# Patient Record
Sex: Male | Born: 1955 | Race: Black or African American | Hispanic: No | Smoking: Never smoker
Health system: Southern US, Community
[De-identification: ages and names within clinical notes are randomized; demographics above are authoritative.]

## PROBLEM LIST (undated history)

## (undated) DIAGNOSIS — I10 Essential (primary) hypertension: Secondary | ICD-10-CM

## (undated) DIAGNOSIS — N200 Calculus of kidney: Secondary | ICD-10-CM

## (undated) DIAGNOSIS — K219 Gastro-esophageal reflux disease without esophagitis: Secondary | ICD-10-CM

## (undated) HISTORY — PX: COLONOSCOPY: SHX174

---

## 2000-02-18 ENCOUNTER — Emergency Department (HOSPITAL_COMMUNITY): Admission: EM | Admit: 2000-02-18 | Discharge: 2000-02-18 | Payer: Self-pay | Admitting: Emergency Medicine

## 2009-08-13 ENCOUNTER — Encounter: Admission: RE | Admit: 2009-08-13 | Discharge: 2009-08-13 | Payer: Self-pay | Admitting: Specialist

## 2010-09-09 ENCOUNTER — Emergency Department (HOSPITAL_COMMUNITY): Admission: EM | Admit: 2010-09-09 | Discharge: 2010-09-09 | Payer: Self-pay | Admitting: Emergency Medicine

## 2010-11-24 ENCOUNTER — Encounter: Payer: Self-pay | Admitting: Internal Medicine

## 2011-02-14 ENCOUNTER — Ambulatory Visit
Admission: RE | Admit: 2011-02-14 | Discharge: 2011-02-14 | Disposition: A | Discharge status: Home / Self Care | Payer: Self-pay | Source: Ambulatory Visit | Attending: Specialist | Admitting: Specialist

## 2011-02-14 ENCOUNTER — Other Ambulatory Visit: Payer: Self-pay | Admitting: Specialist

## 2011-04-17 ENCOUNTER — Other Ambulatory Visit: Payer: Self-pay | Admitting: Internal Medicine

## 2011-04-17 ENCOUNTER — Ambulatory Visit
Admission: RE | Admit: 2011-04-17 | Discharge: 2011-04-17 | Disposition: A | Discharge status: Home / Self Care | Payer: Self-pay | Source: Ambulatory Visit | Attending: Internal Medicine | Admitting: Internal Medicine

## 2011-04-17 DIAGNOSIS — G44009 Cluster headache syndrome, unspecified, not intractable: Secondary | ICD-10-CM

## 2011-06-04 ENCOUNTER — Other Ambulatory Visit: Payer: Self-pay | Admitting: Internal Medicine

## 2011-06-04 DIAGNOSIS — M545 Low back pain, unspecified: Secondary | ICD-10-CM

## 2011-06-05 ENCOUNTER — Ambulatory Visit
Admission: RE | Admit: 2011-06-05 | Discharge: 2011-06-05 | Disposition: A | Discharge status: Home / Self Care | Payer: PRIVATE HEALTH INSURANCE | Source: Ambulatory Visit | Attending: Internal Medicine | Admitting: Internal Medicine

## 2011-06-05 DIAGNOSIS — M545 Low back pain, unspecified: Secondary | ICD-10-CM

## 2012-10-22 ENCOUNTER — Emergency Department (HOSPITAL_COMMUNITY)
Admission: EM | Admit: 2012-10-22 | Discharge: 2012-10-22 | Disposition: A | Discharge status: Home / Self Care | Payer: Self-pay | Attending: Emergency Medicine | Admitting: Emergency Medicine

## 2012-10-22 ENCOUNTER — Encounter (HOSPITAL_COMMUNITY): Payer: Self-pay | Admitting: Neurology

## 2012-10-22 DIAGNOSIS — IMO0002 Reserved for concepts with insufficient information to code with codable children: Secondary | ICD-10-CM | POA: Insufficient documentation

## 2012-10-22 DIAGNOSIS — R1030 Lower abdominal pain, unspecified: Secondary | ICD-10-CM

## 2012-10-22 DIAGNOSIS — Y9389 Activity, other specified: Secondary | ICD-10-CM | POA: Insufficient documentation

## 2012-10-22 DIAGNOSIS — W108XXA Fall (on) (from) other stairs and steps, initial encounter: Secondary | ICD-10-CM | POA: Insufficient documentation

## 2012-10-22 DIAGNOSIS — Y9289 Other specified places as the place of occurrence of the external cause: Secondary | ICD-10-CM | POA: Insufficient documentation

## 2012-10-22 HISTORY — DX: Essential (primary) hypertension: I10

## 2012-10-22 MED ORDER — NAPROXEN 500 MG PO TABS: 500.0000 mg | ORAL_TABLET | Freq: Two times a day (BID) | ORAL | Status: DC

## 2012-10-22 MED ORDER — HYDROCODONE-ACETAMINOPHEN 5-325 MG PO TABS: ORAL_TABLET | ORAL | Status: DC

## 2012-10-22 NOTE — ED Notes (Signed)
Pt is ambulatory. C/o groin pain. Pt was comfortable, resting comfortably in bed. Unable to due nursing assessment of groin due to patient being discharge by PA quickly. When in to assess patient, patient was already dressed and ready for discharge. Pt was seen by PA. Pt was alert and oriented in NAD.

## 2012-10-22 NOTE — ED Provider Notes (Signed)
History     CSN: [id number]  Arrival date & time 10/22/12  2841   First MD Initiated Contact with Patient 10/22/12 [phone]      Chief Complaint  Patient presents with  . Hip Pain    (Consider location/radiation/quality/duration/timing/severity/associated sxs/prior treatment) HPI Comments: Patient presents with complaint of left groin pain for 4 weeks. 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 He has had pain since that time that is worse with movement and walking. He has not treated himself with any medications, ice, or heat. Patient denies any changes in urine including dysuria, hematuria. He denies any change in bowel movements including diarrhea, constipation, or blood in stool. He denies fever, nausea or vomiting. Onset was acute. Course is constant. Nothing makes symptoms better.  The history is provided by the patient.    No past medical history on file.  No past surgical history on file.  No family history on file.  History  Substance Use Topics  . Smoking status: Not on file  . Smokeless tobacco: Not on file  . Alcohol Use: Not on file      Review of Systems  Constitutional: Negative for fever.  HENT: Negative for neck pain.   Eyes: Negative for redness.  Respiratory: Negative for cough.   Cardiovascular: Negative for chest pain.  Gastrointestinal: Negative for nausea, vomiting, abdominal pain, diarrhea and blood in stool.  Genitourinary: Negative for dysuria, hematuria and flank pain.  Musculoskeletal: Positive for myalgias (L groin).  Skin: Negative for rash.  Neurological: Negative for headaches.    Allergies  Review of patient's allergies indicates no known allergies.  Home Medications   Current Outpatient Rx  Name  Route  Sig  Dispense  Refill  . HYDROCHLOROTHIAZIDE 25 MG PO TABS   Oral   Take 25 mg by mouth daily.           BP 125/81  Pulse 79  Temp 98.4 F (36.9 C)  (Oral)  Resp 15  SpO2 94%  Physical Exam  Nursing note and vitals reviewed. Constitutional: He appears well-developed and well-nourished.  HENT:  Head: Normocephalic and atraumatic.  Eyes: Conjunctivae normal are normal. Right eye exhibits no discharge. Left eye exhibits no discharge.  Neck: Normal range of motion. Neck supple.  Cardiovascular: Normal rate, regular rhythm and normal heart sounds.   Pulmonary/Chest: Effort normal and breath sounds normal.  Abdominal: Soft. Bowel sounds are normal. He exhibits no distension and no mass. There is no tenderness. There is no rebound and no guarding. Hernia confirmed negative in the right inguinal area and confirmed negative in the left inguinal area.  Genitourinary: Penis normal.    Right testis shows no mass and no tenderness. Left testis shows no mass and no tenderness.  Musculoskeletal:       Left hip: He exhibits tenderness. He exhibits normal strength, no bony tenderness and no swelling.       Legs:      Patient is tender in the crease of left groin however I do not feel any bulging masses that would be consistent with a hernia. On hernia exam no hernias felt. Suspect adductor strain.   Lymphadenopathy:       Right: No inguinal adenopathy present.       Left: No inguinal adenopathy present.  Neurological: He is alert.  Skin: Skin is warm and dry.  Psychiatric: He has a normal mood and affect.  ED Course  Procedures (including critical care time)  Labs Reviewed - No data to display No results found.   1. Groin pain     9:22 AM Patient seen and examined. Work-up initiated.   Vital signs reviewed and are as follows: Filed Vitals:   10/22/12 0945  BP: 125/81  Pulse: 79  Temp: 98.4 F (36.9 C)  Resp: 15   Patient examined.  Will treat conservatively with anti-inflammatories and pain medication. Patient states he has a history of ulcer but states "it's not that bad". Urged use of NSAIDs with food and to discontinue if  stomach pain against.  Patient counseled on use of narcotic pain medications. Counseled not to combine these medications with others containing tylenol. Urged not to drink alcohol, drive, or perform any other activities that requires focus while taking these medications. The patient verbalizes understanding and agrees with the plan.  Patient encouraged to return with signs and symptoms of obstruction, worsening pain. Urged to followup with surgery referral if not improved in one week.    MDM  Groin pain that started when patient took a step. I suspect this represents a healing groin (adductor) strain. Do not feel anything on exam today that would make me suspicious of a hernia. Patient does not have any signs or symptoms of obstruction. No skin findings or cellulitis. Will treat conservatively for another week. Surgery followup given to rule out hernia if not improved.        Renne Crigler, [place] 10/22/12 1017

## 2012-10-22 NOTE — ED Notes (Signed)
Pt reporting fell yesterday c/o left hip/groin pain. Pt is ambulatory. Pt is alert and oriented.

## 2012-10-26 NOTE — ED Provider Notes (Signed)
Medical screening examination/treatment/procedure(s) were performed by non-physician practitioner and as supervising physician I was immediately available for consultation/collaboration.  Tasheem Elms, MD 10/26/12 1921 

## 2013-03-14 ENCOUNTER — Other Ambulatory Visit: Payer: Self-pay | Admitting: Internal Medicine

## 2013-03-14 ENCOUNTER — Ambulatory Visit
Admission: RE | Admit: 2013-03-14 | Discharge: 2013-03-14 | Disposition: A | Discharge status: Home / Self Care | Payer: No Typology Code available for payment source | Source: Ambulatory Visit | Attending: Internal Medicine | Admitting: Internal Medicine

## 2013-03-14 DIAGNOSIS — T1490XA Injury, unspecified, initial encounter: Secondary | ICD-10-CM

## 2015-04-23 ENCOUNTER — Encounter (HOSPITAL_COMMUNITY): Payer: Self-pay | Admitting: *Deleted

## 2015-04-23 ENCOUNTER — Emergency Department (HOSPITAL_COMMUNITY)
Admission: EM | Admit: 2015-04-23 | Discharge: 2015-04-23 | Disposition: A | Discharge status: Home / Self Care | Payer: No Typology Code available for payment source | Attending: Emergency Medicine | Admitting: Emergency Medicine

## 2015-04-23 DIAGNOSIS — I10 Essential (primary) hypertension: Secondary | ICD-10-CM | POA: Diagnosis not present

## 2015-04-23 DIAGNOSIS — Z791 Long term (current) use of non-steroidal anti-inflammatories (NSAID): Secondary | ICD-10-CM | POA: Insufficient documentation

## 2015-04-23 DIAGNOSIS — K1379 Other lesions of oral mucosa: Secondary | ICD-10-CM | POA: Diagnosis not present

## 2015-04-23 DIAGNOSIS — Z79899 Other long term (current) drug therapy: Secondary | ICD-10-CM | POA: Insufficient documentation

## 2015-04-23 NOTE — ED Notes (Signed)
Declined W/C at D/C and was escorted to lobby by RN. 

## 2015-04-23 NOTE — ED Provider Notes (Signed)
CSN: [id number]     Arrival date & time 04/23/15  6295 History   First MD Initiated Contact with Patient 04/23/15 [phone]     Chief Complaint  Patient presents with  . Mouth Injury     (Consider location/radiation/quality/duration/timing/severity/associated sxs/prior Treatment) HPI Comments: 59 y/o M c/o bleeding from the roof of his mouth "for a while now". Bleeding is intermittent, tends to happen more if he "rubs" the roof of his mouth with his finger. States the bleeding has not happened in about 2 weeks. Nothing in specific happened this morning to bring him to the ED, other than he thought it would be good to have it checked out. Has not discussed this with his PCP. Occasionally it hurts when it is bleeding and only bleeds from the left side. Denies dental pain. No hx of bleeding disorders. Not on any blood thinners.  The history is provided by the patient.    Past Medical History  Diagnosis Date  . Hypertension    History reviewed. No pertinent past surgical history. History reviewed. No pertinent family history. History  Substance Use Topics  . Smoking status: Never Smoker   . Smokeless tobacco: Not on file  . Alcohol Use: No    Review of Systems  HENT:       + Bleeding from roof of mouth.  Hematological: Does not bruise/bleed easily.  All other systems reviewed and are negative.     Allergies  Review of patient's allergies indicates no known allergies.  Home Medications   Prior to Admission medications   Medication Sig Start Date End Date Taking? Authorizing Provider  hydrochlorothiazide (HYDRODIURIL) 25 MG tablet Take 25 mg by mouth daily.    Historical Provider, MD  HYDROcodone-acetaminophen (NORCO/VICODIN) 5-325 MG per tablet Take 1-2 tablets every 6 hours as needed for severe pain 10/22/12   Renne Crigler, PA-C  naproxen (NAPROSYN) 500 MG tablet Take 1 tablet (500 mg total) by mouth 2 (two) times daily. 10/22/12   Renne Crigler, PA-C   BP 148/82 mmHg  Pulse  75  Temp(Src) 97.9 F (36.6 C) (Oral)  Resp 16  Ht 5\' 9"  (1.753 m)  Wt 220 lb (99.791 kg)  BMI 32.47 kg/m2  SpO2 99% Physical Exam  Constitutional: He is oriented to person, place, and time. He appears well-developed and well-nourished. No distress.  HENT:  Head: Normocephalic and atraumatic.  Mouth/Throat: Uvula is midline, oropharynx is clear and moist and mucous membranes are normal. No oral lesions.  No bleeding from roof of mouth. No inflammation. No lesions, injury or masses.  Eyes: Conjunctivae and EOM are normal.  Neck: Normal range of motion. Neck supple.  Cardiovascular: Normal rate, regular rhythm and normal heart sounds.   Pulmonary/Chest: Effort normal and breath sounds normal.  Musculoskeletal: Normal range of motion. He exhibits no edema.  Lymphadenopathy:       Head (right side): No submental and no submandibular adenopathy present.       Head (left side): No submental and no submandibular adenopathy present.  Neurological: He is alert and oriented to person, place, and time.  Skin: Skin is warm and dry.  Psychiatric: He has a normal mood and affect. His behavior is normal.  Nursing note and vitals reviewed.   ED Course  Procedures (including critical care time) Labs Review Labs Reviewed - No data to display  Imaging Review No results found.   EKG Interpretation None      MDM   Final diagnoses:  Bleeding in mouth  Non-toxic appearing, NAD. Afebrile. VSS. No lesions, injury, masses or bleeding on roof of mouth. Has not bled in 2 weeks, worse when rubbing. No hx of bleeding d/o or on blood thinners. No emergent need for any workup today, and can f/u with PCP. Stable for d/c. Return precautions given. Patient states understanding of treatment care plan and is agreeable.  Kathrynn Speed, PA-C 04/23/15 9147  Blane Ohara, MD 04/24/15 [phone]

## 2015-04-23 NOTE — ED Notes (Signed)
Pt reports he has had several day Hx of bleeding from roof of mouth. Pt denies any dental pain.

## 2017-11-03 DIAGNOSIS — I82409 Acute embolism and thrombosis of unspecified deep veins of unspecified lower extremity: Secondary | ICD-10-CM

## 2017-11-03 HISTORY — DX: Acute embolism and thrombosis of unspecified deep veins of unspecified lower extremity: I82.409

## 2019-04-04 ENCOUNTER — Other Ambulatory Visit: Payer: Self-pay

## 2019-04-04 ENCOUNTER — Encounter (HOSPITAL_COMMUNITY): Payer: Self-pay

## 2019-04-04 ENCOUNTER — Observation Stay (HOSPITAL_COMMUNITY)
Admission: EM | Admit: 2019-04-04 | Discharge: 2019-04-05 | Disposition: A | Discharge status: Home / Self Care | Payer: BLUE CROSS/BLUE SHIELD | Attending: Internal Medicine | Admitting: Internal Medicine

## 2019-04-04 ENCOUNTER — Emergency Department (HOSPITAL_COMMUNITY): Payer: BLUE CROSS/BLUE SHIELD

## 2019-04-04 DIAGNOSIS — I10 Essential (primary) hypertension: Secondary | ICD-10-CM | POA: Diagnosis present

## 2019-04-04 DIAGNOSIS — G8929 Other chronic pain: Secondary | ICD-10-CM | POA: Diagnosis not present

## 2019-04-04 DIAGNOSIS — R079 Chest pain, unspecified: Principal | ICD-10-CM | POA: Insufficient documentation

## 2019-04-04 DIAGNOSIS — Z79899 Other long term (current) drug therapy: Secondary | ICD-10-CM | POA: Insufficient documentation

## 2019-04-04 DIAGNOSIS — K219 Gastro-esophageal reflux disease without esophagitis: Secondary | ICD-10-CM | POA: Diagnosis not present

## 2019-04-04 DIAGNOSIS — Z1159 Encounter for screening for other viral diseases: Secondary | ICD-10-CM | POA: Insufficient documentation

## 2019-04-04 DIAGNOSIS — Z7982 Long term (current) use of aspirin: Secondary | ICD-10-CM | POA: Diagnosis not present

## 2019-04-04 DIAGNOSIS — Z791 Long term (current) use of non-steroidal anti-inflammatories (NSAID): Secondary | ICD-10-CM | POA: Diagnosis not present

## 2019-04-04 DIAGNOSIS — Z86718 Personal history of other venous thrombosis and embolism: Secondary | ICD-10-CM | POA: Diagnosis not present

## 2019-04-04 HISTORY — DX: Chest pain, unspecified: R07.9

## 2019-04-04 LAB — BASIC METABOLIC PANEL
Anion gap: 8 (ref 5–15)
BUN: 19 mg/dL (ref 8–23)
CO2: 28 mmol/L (ref 22–32)
Calcium: 8.9 mg/dL (ref 8.9–10.3)
Chloride: 100 mmol/L (ref 98–111)
Creatinine, Ser: 1.43 mg/dL — ABNORMAL HIGH (ref 0.61–1.24)
GFR calc Af Amer: >60 mL/min (ref >60)
GFR calc non Af Amer: 52 mL/min — ABNORMAL LOW (ref >60)
Glucose, Bld: 119 mg/dL — ABNORMAL HIGH (ref 70–99)
Potassium: 3.6 mmol/L (ref 3.5–5.1)
Sodium: 136 mmol/L (ref 135–145)

## 2019-04-04 LAB — CBC
HCT: 44.4 % (ref 39.0–52.0)
Hemoglobin: 14.6 g/dL (ref 13.0–17.0)
MCH: 29 pg (ref 26.0–34.0)
MCHC: 32.9 g/dL (ref 30.0–36.0)
MCV: 88.3 fL (ref 80.0–100.0)
Platelets: 229 10*3/uL (ref 150–400)
RBC: 5.03 MIL/uL (ref 4.22–5.81)
RDW: 12.5 % (ref 11.5–15.5)
WBC: 6 10*3/uL (ref 4.0–10.5)
nRBC: 0 % (ref 0.0–0.2)

## 2019-04-04 LAB — TROPONIN I
Troponin I: <0.03 ng/mL (ref <0.03)
Troponin I: <0.03 ng/mL (ref <0.03)

## 2019-04-04 LAB — D-DIMER, QUANTITATIVE: D-Dimer, Quant: 0.33 ug/mL-FEU (ref 0.00–0.50)

## 2019-04-04 LAB — SARS CORONAVIRUS 2 BY RT PCR (HOSPITAL ORDER, PERFORMED IN ~~LOC~~ HOSPITAL LAB): SARS Coronavirus 2: NEGATIVE

## 2019-04-04 MED ORDER — ASPIRIN 81 MG PO CHEW
324.0000 mg | CHEWABLE_TABLET | Freq: Once | ORAL | Status: AC
  Administered 2019-04-04: 21:00:00 324 mg via ORAL
  Filled 2019-04-04: qty 4

## 2019-04-04 MED ORDER — SODIUM CHLORIDE 0.9% FLUSH: 3.0000 mL | Freq: Once | INTRAVENOUS | Status: DC

## 2019-04-04 MED ORDER — IOHEXOL 350 MG/ML SOLN
100.0000 mL | Freq: Once | INTRAVENOUS | Status: AC | PRN
  Administered 2019-04-04: 21:00:00 100 mL via INTRAVENOUS

## 2019-04-04 MED ORDER — SODIUM CHLORIDE (PF) 0.9 % IJ SOLN
INTRAMUSCULAR | Status: AC
  Filled 2019-04-04: qty 50

## 2019-04-04 NOTE — H&P (Signed)
Jimmy Hamilton KGS:[id number] DOB: 04/04/1956 DOA: 04/04/2019     PCP: Rometta Emery, MD   Outpatient Specialists:  NONE    Patient arrived to ER on 04/04/19 at 1632  Patient coming from: home Lives alone,    Chief Complaint:  Chief Complaint  Patient presents with   Chest Pain    HPI: Jimmy Hamilton is a 63 y.o. male with medical history significant of GERD, HTN    Presented with   Chest pain and intermittent worse since yesterday no associated shortness of breath no nausea diaphoresis also have had some abdominal pain for the past 1 week reports left lower extremity pain behind the knee states has been going on for a long time.  States currently feeling better He is unsure for how long the chest pain has lasted Had any stress test or otherwise cardiac work-up  Infectious risk factors:  Reports chest pain  In RAPID COVID TEST NEGATIVE     Regarding pertinent Chronic problems:      HTN on HCTZ    While in ER: CTA neg for PE No infiltrate Troponin negative The following Work up has been ordered so far:  Orders Placed This Encounter  Procedures   SARS Coronavirus 2 (CEPHEID - Performed in [facility] hospital lab), Hosp Order   DG Chest 2 View   CT Angio Chest PE W and/or Wo Contrast   Basic metabolic panel   CBC   Troponin I - ONCE - STAT   Troponin I - Now Then Q6H   D-dimer, quantitative (not at [facility])   Cardiac monitoring   Saline Lock IV, Maintain IV access   Consult to hospitalist  ALL PATIENTS BEING ADMITTED/HAVING PROCEDURES NEED COVID-19 SCREENING   Consult to hospitalist  ALL PATIENTS BEING ADMITTED/HAVING PROCEDURES NEED COVID-19 SCREENING   Pulse oximetry, continuous   EKG 12-Lead   ED EKG within 10 minutes   EKG 12-Lead   Place in observation (patient's expected length of stay will be less than 2 midnights)     Following Medications were ordered in ER: Medications  sodium chloride flush (NS) 0.9 % injection 3 mL (has no  administration in time range)  sodium chloride (PF) 0.9 % injection (has no administration in time range)  aspirin chewable tablet 324 mg (324 mg Oral Given 04/04/19 2046)  iohexol (OMNIPAQUE) 350 MG/ML injection 100 mL (100 mLs Intravenous Contrast Given 04/04/19 2107)        Consult Orders  (From admission, onward)         Start     Ordered   04/04/19 2204  Consult to hospitalist  ALL PATIENTS BEING ADMITTED/HAVING PROCEDURES NEED COVID-19 SCREENING  Once    Comments:  ALL PATIENTS BEING ADMITTED/HAVING PROCEDURES NEED COVID-19 SCREENING  Provider:  John Giovanni, MD  Question Answer Comment  Place call to: [facility]   Reason for Consult Admit      04/04/19 2203   04/04/19 2133  Consult to hospitalist  ALL PATIENTS BEING ADMITTED/HAVING PROCEDURES NEED COVID-19 SCREENING  Once    Comments:  ALL PATIENTS BEING ADMITTED/HAVING PROCEDURES NEED COVID-19 SCREENING  Provider:  John Giovanni, MD  Question Answer Comment  Place call to: [facility]   Reason for Consult Admit      04/04/19 2132           Significant initial  Findings: Abnormal Labs Reviewed  BASIC METABOLIC PANEL - Abnormal; Notable for the following components:      Result Value  Glucose, Bld 119 (*)    Creatinine, Ser 1.43 (*)    GFR calc non Af Amer 52 (*)    All other components within normal limits     Otherwise labs showing:    Recent Labs  Lab 04/04/19 1957  NA 136  K 3.6  CO2 28  GLUCOSE 119*  BUN 19  CREATININE 1.43*  CALCIUM 8.9    Cr unclear baseline Lab Results  Component Value Date   CREATININE 1.43 (H) 04/04/2019    No results for input(s): AST, ALT, ALKPHOS, BILITOT, PROT, ALBUMIN in the last 168 hours. Lab Results  Component Value Date   CALCIUM 8.9 04/04/2019      WBC       Component Value Date/Time   WBC 6.0 04/04/2019 1957     Plt: Lab Results  Component Value Date   PLT 229 04/04/2019     COVID-19 Labs  Recent Labs     04/04/19 1957  DDIMER 0.33    Lab Results  Component Value Date   SARSCOV2NAA NEGATIVE 04/04/2019      HG/HCT stable,  Down *Up from baseline see below    Component Value Date/Time   HGB 14.6 04/04/2019 1957   HCT 44.4 04/04/2019 1957    Troponin  Cardiac Panel (last 3 results) Recent Labs    04/04/19 1957 04/04/19 2245  TROPONINI <0.03 <0.03         CT HEAD * NON acute  CXR - *NON acute    CTA chest -  nonacute, no PE,  no evidence of infiltrate   ECG:  Personally reviewed by me showing: HR : 77 Rhythm:  NSR nonspecific changes,  QTC 426      ED Triage Vitals  Enc Vitals Group     BP 04/04/19 1638 (!) 155/73     Pulse Rate 04/04/19 1638 80     Resp 04/04/19 1638 16     Temp 04/04/19 1638 98.2 F (36.8 C)     Temp Source 04/04/19 1638 Oral     SpO2 04/04/19 1638 99 %     Weight 04/04/19 1702 165 lb (74.8 kg)     Height 04/04/19 1702  (1.753 m)     Head Circumference --      Peak Flow --      Pain Score 04/04/19 1702 0     Pain Loc --      Pain Edu? --      Excl. in GC? --   TMAX(24)@       Latest  Blood pressure 137/77, pulse 71, temperature 98.6 F (37 C), temperature source Oral, resp. rate 18, height  (1.753 m), weight 74.8 kg, SpO2 100 %.     Hospitalist was called for admission for  Chest pain evaluation   Review of Systems:    Pertinent positives include:  chest pain, leg pain  Constitutional:  No weight loss, night sweats, Fevers, chills, fatigue, weight loss  HEENT:  No headaches, Difficulty swallowing,Tooth/dental problems,Sore throat,  No sneezing, itching, ear ache, nasal congestion, post nasal drip,  Cardio-vascular:  No, Orthopnea, PND, anasarca, dizziness, palpitations.no Bilateral lower extremity swelling  GI:  No heartburn, indigestion, abdominal pain, nausea, vomiting, diarrhea, change in bowel habits, loss of appetite, melena, blood in stool, hematemesis Resp:  no shortness of breath at rest. No dyspnea on  exertion, No excess mucus, no productive cough, No non-productive cough, No coughing up of blood.No change in color of mucus.No wheezing. Skin:  no rash or lesions. No jaundice GU:  no dysuria, change in color of urine, no urgency or frequency. No straining to urinate.  No flank pain.  Musculoskeletal:  No joint pain or no joint swelling. No decreased range of motion. No back pain.  Psych:  No change in mood or affect. No depression or anxiety. No memory loss.  Neuro: no localizing neurological complaints, no tingling, no weakness, no double vision, no gait abnormality, no slurred speech, no confusion  All systems reviewed and apart from HOPI all are negative  Past Medical History:   Past Medical History:  Diagnosis Date   Hypertension       History reviewed. No pertinent surgical history.  Social History:  Ambulatory   independently       reports that he has never smoked. He has never used smokeless tobacco. He reports that he does not drink alcohol or use drugs.     Family History:   Family History  Problem Relation Age of Onset   Diabetes Brother    CAD Neg Hx    Cancer Neg Hx     Allergies: No Known Allergies   Prior to Admission medications   Medication Sig Start Date End Date Taking? Authorizing Provider  hydrochlorothiazide (HYDRODIURIL) 25 MG tablet Take 25 mg by mouth daily.   Yes [provider]  ibuprofen (ADVIL) 800 MG tablet Take 800 mg by mouth 3 (three) times daily as needed for moderate pain.  03/10/19  Yes [provider]  omeprazole (PRILOSEC) 40 MG capsule Take 40 mg by mouth 2 (two) times daily. 04/01/19  Yes [provider]  HYDROcodone-acetaminophen (NORCO/VICODIN) 5-325 MG per tablet Take 1-2 tablets every 6 hours as needed for severe pain Patient not taking: Reported on 04/04/2019 10/22/12   Renne Crigler, PA-C  naproxen (NAPROSYN) 500 MG tablet Take 1 tablet (500 mg total) by mouth 2 (two) times daily. Patient  not taking: Reported on 04/04/2019 10/22/12   Renne Crigler, PA-C   Physical Exam: Blood pressure 137/77, pulse 71, temperature 98.6 F (37 C), temperature source Oral, resp. rate 18, height  (1.753 m), weight 74.8 kg, SpO2 100 %. 1. General:  in No   Acute distress   well  -appearing 2. Psychological: Alert and  Oriented 3. Head/ENT:  Dry Mucous Membranes                          Head Non traumatic, neck supple                          Poor Dentition 4. SKIN: decreased Skin turgor,  Skin clean Dry and intact no rash 5. Heart: Regular rate and rhythm no Murmur, no Rub or gallop 6. Lungs:   no wheezes or crackles   7. Abdomen: Soft,  non-tender, Non distended  bowel sounds present 8. Lower extremities: no clubbing, cyanosis, no  edema 9. Neurologically Grossly intact, moving all 4 extremities equally   10. MSK: Normal range of motion   All other LABS:     Recent Labs  Lab 04/04/19 1957  WBC 6.0  HGB 14.6  HCT 44.4  MCV 88.3  PLT 229     Recent Labs  Lab 04/04/19 1957  NA 136  K 3.6  CL 100  CO2 28  GLUCOSE 119*  BUN 19  CREATININE 1.43*  CALCIUM 8.9     No results for input(s): AST,  ALT, ALKPHOS, BILITOT, PROT, ALBUMIN in the last 168 hours.     Cultures: No results found for: SDES, SPECREQUEST, CULT, REPTSTATUS   Radiological Exams on Admission: Dg Chest 2 View  Result Date: 04/04/2019 CLINICAL DATA:  Intermittent left chest pain since yesterday. EXAM: CHEST - 2 VIEW COMPARISON:  02/14/2011 FINDINGS: The lungs are hypoinflated with extension way shin of the cardiac silhouette and of the bronchovascular markings. No airspace consolidation, edema, pleural effusion, or pneumothorax is identified. No acute osseous abnormality is seen. IMPRESSION: Hypoinflation without evidence of acute airspace disease. Electronically Signed   By: Sebastian AcheAllen  Grady M.D.   On: 04/04/2019 17:58   Ct Angio Chest Pe W And/or Wo Contrast  Result Date: 04/04/2019 CLINICAL DATA:   Intermittent left-sided chest pain since yesterday. No shortness of breath. EXAM: CT ANGIOGRAPHY CHEST WITH CONTRAST TECHNIQUE: Multidetector CT imaging of the chest was performed using the standard protocol during bolus administration of intravenous contrast. Multiplanar CT image reconstructions and MIPs were obtained to evaluate the vascular anatomy. CONTRAST:  100mL OMNIPAQUE IOHEXOL 350 MG/ML SOLN COMPARISON:  None. FINDINGS: Cardiovascular: The heart is normal in size. No pericardial effusion. The aorta is normal in caliber. No dissection or atherosclerotic calcifications. No definite coronary artery calcifications. The pulmonary arterial tree is fairly well opacified. No filling defects to suggest pulmonary embolism. Mediastinum/Nodes: Scattered mediastinal and hilar lymph nodes but no mass or overt adenopathy. There also scattered bilateral axillary lymph nodes. The esophagus is grossly normal. Lungs/Pleura: The lungs are clear. No infiltrates, edema or effusions. No worrisome pulmonary lesions. No bronchiectasis or interstitial lung disease. Upper Abdomen: No significant upper abdominal findings. Simple appearing 4 cm left renal cyst. Musculoskeletal: No significant bony findings. Moderate degenerative changes involving the lower thoracic spine. Review of the MIP images confirms the above findings. IMPRESSION: 1. No CT findings for pulmonary embolism. 2. Normal thoracic aorta. 3. No acute pulmonary findings or worrisome pulmonary lesions. 4. Scattered mediastinal and hilar lymph nodes but no mass or overt adenopathy. Electronically Signed   By: Rudie MeyerP.  Gallerani M.D.   On: 04/04/2019 21:21    Chart has been reviewed    Assessment/Plan  63 y.o. male with medical history significant of GERD, HTN     Admitted for Chest pain evaluation  Present on Admission:   Chest pain - - H=  1  ,E=    ,A= 2   , R   1 , T 0  ,  for the  Total of 4 therefore will admit for observation and further evaluation (  Risk of MACE: Scores 0-3  of 0.9-1.7%.,  4-6: 12-16.6% , Scores ?7: 50-65% )     - monitor on telemetry, cycle cardiac enzymes, obtain serial ECG and  ECHO in AM.   - Daily aspirin -  Further risk stratify with lipid panel, hgA1C, obtain TSH.  Make sure patient is on Aspirin.  We will notify cardiology regarding patient's admission. Further management depends on pending  workup   Essential hypertension -stable we will continue home medications  Leg Pain negative d-dimer most likely musculoskeletal is been going on for a long time will need to follow-up as an outpatient Other plan as per orders.  DVT prophylaxis:    Lovenox     Code Status:  FULL CODE   as per patient   I had personally discussed CODE STATUS with patient    Family Communication:   Family not at  Bedside    Disposition Plan:  To home once workup is complete and patient is stable                       Consults called: email cards Admission status:  ED Disposition    ED Disposition Condition Comment   Admit  Hospital Area: [facility] [[id number]]  Level of Care: Telemetry [5]  Admit to tele based on following criteria: Monitor for Ischemic changes  Covid Evaluation: N/A  Diagnosis: Chest pain [161096]  Admitting Physician: Therisa Doyne [[id number]]  Attending Physician: Therisa Doyne [[id number]]  PT Class (Do Not Modify): Observation [104]  PT Acc Code (Do Not Modify): Observation [10022]       Obs    Level of care    tele  For 12H   Precautions:  NONE  No active isolations  PPE: Used by the provider:   P100  eye Goggles,  Gloves       Leitha Hyppolite 04/05/2019, 1:17 AM    [facility]     after 2 AM please page floor coverage PA If 7AM-7PM, please contact the day team taking care of the patient using [email or web address]

## 2019-04-04 NOTE — ED Provider Notes (Signed)
[facility]-EMERGENCY DEPT Provider Note   CSN: [id number] Arrival date & time: 04/04/19  1632    History   Chief Complaint Chief Complaint  Patient presents with  . Chest Pain    HPI Jimmy Hamilton is a 63 y.o. male.     The history is provided by the patient.  Chest Pain  Pain location:  Substernal area Pain quality: pressure   Pain radiates to:  Does not radiate Pain severity:  Mild Onset quality:  Gradual Duration:  3 days Timing:  Intermittent Progression:  Waxing and waning Chronicity:  Recurrent Context: at rest   Relieved by:  Nothing Worsened by:  Nothing Associated symptoms: abdominal pain (chronic left groin issue)   Associated symptoms: no back pain, no cough, no fever, no orthopnea, no palpitations, no shortness of breath and no vomiting   Risk factors: hypertension and male sex   Risk factors: no coronary artery disease     Past Medical History:  Diagnosis Date  . Hypertension     There are no active problems to display for this patient.   History reviewed. No pertinent surgical history.      Home Medications    Prior to Admission medications   Medication Sig Start Date End Date Taking? Authorizing Provider  hydrochlorothiazide (HYDRODIURIL) 25 MG tablet Take 25 mg by mouth daily.   Yes [provider]  ibuprofen (ADVIL) 800 MG tablet Take 800 mg by mouth 3 (three) times daily as needed for moderate pain.  03/10/19  Yes [provider]  omeprazole (PRILOSEC) 40 MG capsule Take 40 mg by mouth 2 (two) times daily. 04/01/19  Yes [provider]  HYDROcodone-acetaminophen (NORCO/VICODIN) 5-325 MG per tablet Take 1-2 tablets every 6 hours as needed for severe pain Patient not taking: Reported on 04/04/2019 10/22/12   Renne Crigler, PA-C  naproxen (NAPROSYN) 500 MG tablet Take 1 tablet (500 mg total) by mouth 2 (two) times daily. Patient not taking: Reported on 04/04/2019 10/22/12   Renne Crigler, PA-C    Family History Family History  Family history unknown: Yes    Social History Social History   Tobacco Use  . Smoking status: Never Smoker  . Smokeless tobacco: Never Used  Substance Use Topics  . Alcohol use: No  . Drug use: No     Allergies   Patient has no known allergies.   Review of Systems Review of Systems  Constitutional: Negative for chills and fever.  HENT: Negative for ear pain and sore throat.   Eyes: Negative for pain and visual disturbance.  Respiratory: Negative for cough and shortness of breath.   Cardiovascular: Positive for chest pain. Negative for palpitations and orthopnea.  Gastrointestinal: Positive for abdominal pain (chronic left groin issue). Negative for vomiting.  Genitourinary: Negative for dysuria and hematuria.  Musculoskeletal: Negative for arthralgias and back pain.  Skin: Negative for color change and rash.  Neurological: Negative for seizures and syncope.  All other systems reviewed and are negative.    Physical Exam Updated Vital Signs BP 137/77   Pulse 71   Temp 98.6 F (37 C) (Oral)   Resp 18   Ht 5\' 9"  (1.753 m)   Wt 74.8 kg   SpO2 100%   BMI 24.37 kg/m   Physical Exam Vitals signs and nursing note reviewed.  Constitutional:      General: He is not in acute distress.    Appearance: He is well-developed. He is not ill-appearing.  HENT:  Head: Normocephalic and atraumatic.  Eyes:     Conjunctiva/sclera: Conjunctivae normal.     Pupils: Pupils are equal, round, and reactive to light.  Neck:     Musculoskeletal: Normal range of motion and neck supple.  Cardiovascular:     Rate and Rhythm: Normal rate and regular rhythm.     Pulses:          Radial pulses are 2+ on the right side and 2+ on the left side.     Heart sounds: No murmur.  Pulmonary:     Effort: Pulmonary effort is normal. No respiratory distress.     Breath sounds: Normal breath sounds. No decreased breath sounds, wheezing, rhonchi or rales.   Abdominal:     Palpations: Abdomen is soft.     Tenderness: There is no abdominal tenderness.  Musculoskeletal: Normal range of motion.  Skin:    General: Skin is warm and dry.     Capillary Refill: Capillary refill takes less than 2 seconds.  Neurological:     General: No focal deficit present.     Mental Status: He is alert.      ED Treatments / Results  Labs (all labs ordered are listed, but only abnormal results are displayed) Labs Reviewed  BASIC METABOLIC PANEL - Abnormal; Notable for the following components:      Result Value   Glucose, Bld 119 (*)    Creatinine, Ser 1.43 (*)    GFR calc non Af Amer 52 (*)    All other components within normal limits  SARS CORONAVIRUS 2 (HOSPITAL ORDER, PERFORMED IN [facility])  CBC  TROPONIN I  TROPONIN I  TROPONIN I    EKG EKG Interpretation  Date/Time:  Monday April 04 2019 16:58:00 EDT Ventricular Rate:  77 PR Interval:    QRS Duration: 97 QT Interval:  376 QTC Calculation: 426 R Axis:   -40 Text Interpretation:  Sinus rhythm Prolonged PR interval Left anterior fascicular block Abnormal R-wave progression, early transition Left ventricular hypertrophy Nonspecific T abnormalities, inferior leads Confirmed by Virgina Norfolk [phone]) on 04/04/2019 5:12:52 PM   Radiology Dg Chest 2 View  Result Date: 04/04/2019 CLINICAL DATA:  Intermittent left chest pain since yesterday. EXAM: CHEST - 2 VIEW COMPARISON:  02/14/2011 FINDINGS: The lungs are hypoinflated with extension way shin of the cardiac silhouette and of the bronchovascular markings. No airspace consolidation, edema, pleural effusion, or pneumothorax is identified. No acute osseous abnormality is seen. IMPRESSION: Hypoinflation without evidence of acute airspace disease. Electronically Signed   By: Sebastian Ache M.D.   On: 04/04/2019 17:58   Ct Angio Chest Pe W And/or Wo Contrast  Result Date: 04/04/2019 CLINICAL DATA:  Intermittent left-sided chest pain since  yesterday. No shortness of breath. EXAM: CT ANGIOGRAPHY CHEST WITH CONTRAST TECHNIQUE: Multidetector CT imaging of the chest was performed using the standard protocol during bolus administration of intravenous contrast. Multiplanar CT image reconstructions and MIPs were obtained to evaluate the vascular anatomy. CONTRAST:  OMNIPAQUE IOHEXOL 350 MG/ML SOLN COMPARISON:  None. FINDINGS: Cardiovascular: The heart is normal in size. No pericardial effusion. The aorta is normal in caliber. No dissection or atherosclerotic calcifications. No definite coronary artery calcifications. The pulmonary arterial tree is fairly well opacified. No filling defects to suggest pulmonary embolism. Mediastinum/Nodes: Scattered mediastinal and hilar lymph nodes but no mass or overt adenopathy. There also scattered bilateral axillary lymph nodes. The esophagus is grossly normal. Lungs/Pleura: The lungs are clear. No infiltrates, edema or effusions.  No worrisome pulmonary lesions. No bronchiectasis or interstitial lung disease. Upper Abdomen: No significant upper abdominal findings. Simple appearing 4 cm left renal cyst. Musculoskeletal: No significant bony findings. Moderate degenerative changes involving the lower thoracic spine. Review of the MIP images confirms the above findings. IMPRESSION: 1. No CT findings for pulmonary embolism. 2. Normal thoracic aorta. 3. No acute pulmonary findings or worrisome pulmonary lesions. 4. Scattered mediastinal and hilar lymph nodes but no mass or overt adenopathy. Electronically Signed   By: Rudie MeyerP.  Gallerani M.D.   On: 04/04/2019 21:21    Procedures Procedures (including critical care time)  Medications Ordered in ED Medications  sodium chloride flush (NS) 0.9 % injection 3 mL (has no administration in time range)  sodium chloride (PF) 0.9 % injection (has no administration in time range)  aspirin chewable tablet 324 mg (324 mg Oral Given 04/04/19 2046)  iohexol (OMNIPAQUE) 350 MG/ML  injection 100 mL (100 mLs Intravenous Contrast Given 04/04/19 2107)     Initial Impression / Assessment and Plan / ED Course  I have reviewed the triage vital signs and the nursing notes.  Pertinent labs & imaging results that were available during my care of the patient were reviewed by me and considered in my medical decision making (see chart for details).     Jimmy Hamilton is a 63 year old male here with chest pain.  Patient with history of hypertension.  Has had intermittent chest pain for the past year but slightly worse over the last 3 days.  Pain is more constant and worse with exertion.  Patient has never had a stress test.  Patient denies any trauma.  Unaware if he has ever had a blood clot before.  Patient also talks about chronic left groin pain.  Likely mild hernia or MSK. No obvious tenderness on exam.  Will focus on chest pain for today's visit.    EKG shows T wave inversions in the lateral leads, also in the inferior leads.  No prior to compare to.  Patient with chest x-ray that was overall unremarkable.  PE scan was performed that showed no acute findings.  No significant electrolyte issues, kidney injury, leukocytosis.  Given progressive chest pain over the last several days with possible new EKG changes will admit for further ACS rule out.  Pain improved following aspirin.  Admitted to medicine in stable condition.  This chart was dictated using voice recognition software.  Despite best efforts to proofread,  errors can occur which can change the documentation meaning.    Final Clinical Impressions(s) / ED Diagnoses   Final diagnoses:  Chest pain, unspecified type    ED Discharge Orders    None       Virgina NorfolkCuratolo, Brooklinn Longbottom, DO 04/04/19 2257

## 2019-04-04 NOTE — ED Notes (Signed)
Patient transported to CT 

## 2019-04-04 NOTE — ED Triage Notes (Addendum)
Patient c/o intermittent left chest pain since yesterday. Patient denies any SOB, nausea, or diaphoresis.  Patient c/o LLQ pain x 1 week. Patient denies any diarrhea or vomiting.

## 2019-04-04 NOTE — ED Notes (Signed)
Waiting on COVID swabs from lab.

## 2019-04-05 ENCOUNTER — Observation Stay (HOSPITAL_BASED_OUTPATIENT_CLINIC_OR_DEPARTMENT_OTHER): Payer: BLUE CROSS/BLUE SHIELD

## 2019-04-05 ENCOUNTER — Other Ambulatory Visit: Payer: Self-pay | Admitting: Student

## 2019-04-05 ENCOUNTER — Encounter (HOSPITAL_COMMUNITY): Payer: Self-pay | Admitting: Nurse Practitioner

## 2019-04-05 DIAGNOSIS — R079 Chest pain, unspecified: Secondary | ICD-10-CM

## 2019-04-05 DIAGNOSIS — I10 Essential (primary) hypertension: Secondary | ICD-10-CM

## 2019-04-05 DIAGNOSIS — R072 Precordial pain: Secondary | ICD-10-CM

## 2019-04-05 DIAGNOSIS — I371 Nonrheumatic pulmonary valve insufficiency: Secondary | ICD-10-CM

## 2019-04-05 LAB — LIPID PANEL
Cholesterol: 156 mg/dL (ref 0–200)
HDL: 50 mg/dL (ref >40)
LDL Cholesterol: 95 mg/dL (ref 0–99)
Total CHOL/HDL Ratio: 3.1 RATIO
Triglycerides: 54 mg/dL (ref <150)
VLDL: 11 mg/dL (ref 0–40)

## 2019-04-05 LAB — URINALYSIS, ROUTINE W REFLEX MICROSCOPIC
Bilirubin Urine: NEGATIVE
Glucose, UA: NEGATIVE mg/dL
Hgb urine dipstick: NEGATIVE
Ketones, ur: NEGATIVE mg/dL
Leukocytes,Ua: NEGATIVE
Nitrite: NEGATIVE
Protein, ur: NEGATIVE mg/dL
Specific Gravity, Urine: 1.023 (ref 1.005–1.030)
pH: 6 (ref 5.0–8.0)

## 2019-04-05 LAB — HEMOGLOBIN A1C
Hgb A1c MFr Bld: 6.2 % — ABNORMAL HIGH (ref 4.8–5.6)
Mean Plasma Glucose: 131.24 mg/dL

## 2019-04-05 LAB — TROPONIN I
Troponin I: <0.03 ng/mL (ref <0.03)
Troponin I: <0.03 ng/mL (ref <0.03)

## 2019-04-05 LAB — ECHOCARDIOGRAM COMPLETE
Height: 69 in
Weight: 2645.52 oz

## 2019-04-05 MED ORDER — ONDANSETRON HCL 4 MG/2ML IJ SOLN: 4.0000 mg | Freq: Four times a day (QID) | INTRAMUSCULAR | Status: DC | PRN

## 2019-04-05 MED ORDER — ENOXAPARIN SODIUM 40 MG/0.4ML ~~LOC~~ SOLN
40.0000 mg | SUBCUTANEOUS | Status: DC
  Administered 2019-04-05: 40 mg via SUBCUTANEOUS
  Filled 2019-04-05: qty 0.4

## 2019-04-05 MED ORDER — ACETAMINOPHEN 325 MG PO TABS: 650.0000 mg | ORAL_TABLET | ORAL | Status: DC | PRN

## 2019-04-05 MED ORDER — ASPIRIN 81 MG PO TBEC: 81.0000 mg | DELAYED_RELEASE_TABLET | Freq: Every day | ORAL | 0 refills | Status: AC

## 2019-04-05 MED ORDER — ASPIRIN EC 81 MG PO TBEC
81.0000 mg | DELAYED_RELEASE_TABLET | Freq: Every day | ORAL | Status: DC
  Administered 2019-04-05: 81 mg via ORAL
  Filled 2019-04-05: qty 1

## 2019-04-05 MED ORDER — HYDROCHLOROTHIAZIDE 25 MG PO TABS
25.0000 mg | ORAL_TABLET | Freq: Every day | ORAL | Status: DC
  Administered 2019-04-05: 25 mg via ORAL
  Filled 2019-04-05: qty 1

## 2019-04-05 MED ORDER — ENOXAPARIN SODIUM 40 MG/0.4ML ~~LOC~~ SOLN: 40.0000 mg | SUBCUTANEOUS | Status: DC

## 2019-04-05 NOTE — Consult Note (Signed)
Cardiology Consult    Patient ID: Jimmy Hamilton MRN: [id number], DOB/AGE: 1956/07/30   Admit date: 04/04/2019 Date of Consult: 04/05/2019  Primary Physician: Rometta Emery, MD Primary Cardiologist: New to [facility] (Dr. Rennis Golden) Requesting Provider: Therisa Doyne, MD  Patient Profile    Jimmy Hamilton is a 63 y.o. male with a history of hypertension and GERD but no known cardiac history, who is being seen today for the evaluation of chest pain at the request of Dr. Adela Glimpse.  History of Present Illness    Mr. Jimmy Hamilton is a 63 year old male from [place] with a history of hypertension and GERD but no known cardiac history.   Patient presented to the ED yesterday for further evaluation of chest pain. Patient is from [place] and speaks English but did have trouble understanding some of my questions. Therefore, difficult to obtain very detailed history. Patient reports intermittent very mild chest pain over the past week. Patient ranks the pain as a 2/10 on the pain scale but has a difficult time describing the pain. He states he notices the pain when he takes a deep breath. No exertional chest pain. No associated diaphoresis, shortness of breath, nausea/vomiting,  palpitations, lightheadedness/dizziness, or syncope. No orthopnea or PND. He reports occasional mild swelling behind left knee as well as behind behind bilateral knees especially with bending of his leg. Patient reports similar chest pain last year prior to going to [place]. Pain reportedly worsened in [place] with the intense heat. It sounds like he had an Echo in [place] but unclear of what it should. Patient also reports he was diagnosed with a lower extremity DVT last year while visiting [place]. He was supposedly started on treatment for that which he continued for several months. He did see his PCP for this when he got home. Patient called PCP about his recent chest pain and PCP recommended he come to the ED for further evaluation. No  recent fevers, chills, respiratory symptoms, or known exposure to the coronavirus.   In the ED, patient mildly hypertensive but vitals stable. EKG showed T wave inversion in inferior leads. Initial troponin negative. Chest x-ray showed hypoinflation but no acute findings. D-dimer negative. Chest CTA showed no evidence of pulmonary embolism. CBC unremarkable. Na 136, K 3.6, Glucose 119, SCr 1.43. COVID -19 testing negative.  At the time of this evaluation, patient is chest pain free and has no other complaints.  Patient denies any history of tobacco, alcohol, or drug use. He has no known family history of heart disease.   Past Medical History   Past Medical History:  Diagnosis Date   Hypertension     History reviewed. No pertinent surgical history.   Allergies  No Known Allergies  Inpatient Medications     aspirin EC  81 mg Oral Daily   enoxaparin (LOVENOX) injection  40 mg Subcutaneous Q24H   hydrochlorothiazide  25 mg Oral Daily   sodium chloride flush  3 mL Intravenous Once    Family History    Family History  Problem Relation Age of Onset   Diabetes Brother    CAD Neg Hx    Cancer Neg Hx    He indicated that his mother is deceased. He indicated that his father is deceased. He indicated that the status of his brother is unknown. He indicated that the status of his neg hx is unknown.   Social History    Social History   Socioeconomic History   Marital status: Single  Spouse name: Not on file   Number of children: Not on file   Years of education: Not on file   Highest education level: Not on file  Occupational History   Not on file  Social Needs   Financial resource strain: Not on file   Food insecurity:    Worry: Not on file    Inability: Not on file   Transportation needs:    Medical: Not on file    Non-medical: Not on file  Tobacco Use   Smoking status: Never Smoker   Smokeless tobacco: Never Used  Substance and Sexual Activity    Alcohol use: No   Drug use: No   Sexual activity: Not on file  Lifestyle   Physical activity:    Days per week: Not on file    Minutes per session: Not on file   Stress: Not on file  Relationships   Social connections:    Talks on phone: Not on file    Gets together: Not on file    Attends religious service: Not on file    Active member of club or organization: Not on file    Attends meetings of clubs or organizations: Not on file    Relationship status: Not on file   Intimate partner violence:    Fear of current or ex partner: Not on file    Emotionally abused: Not on file    Physically abused: Not on file    Forced sexual activity: Not on file  Other Topics Concern   Not on file  Social History Narrative   Not on file     Review of Systems    Review of Systems  Constitutional: Negative for chills, diaphoresis and fever.  HENT: Negative for congestion and sore throat.   Respiratory: Negative for cough, hemoptysis and shortness of breath.   Cardiovascular: Positive for chest pain and leg swelling. Negative for palpitations, orthopnea and PND.  Gastrointestinal: Positive for abdominal pain (occasionally after eating spicy foods). Negative for blood in stool, nausea and vomiting.  Genitourinary: Negative for hematuria.  Musculoskeletal: Negative for joint pain.  Neurological: Negative for dizziness and loss of consciousness.  Endo/Heme/Allergies: Does not bruise/bleed easily.  Psychiatric/Behavioral: Negative for substance abuse.    Physical Exam    Blood pressure 124/66, pulse 65, temperature 97.6 F (36.4 C), resp. rate 14, height  (1.753 m), weight 75 kg, SpO2 100 %.  General: 63 y.o. male resting comfortably in no acute distress. Pleasant and cooperative. HEENT: Normal  Neck: Supple. No carotid bruits. Lungs: No increased work of breathing. Clear to auscultation bilaterally. No wheezes, rhonchi, or rales. Heart: RRR. Distinct S1 and S2. No murmurs,  gallops, or rubs.  Abdomen: Soft, non-distended, and non-tender to palpation. Bowel sounds present in all 4 quadrants.   Extremities: No clubbing, cyanosis or edema. Radial and distal pedal pulses 2+ and equal bilaterally. Skin: Warm and dry. Neuro: Alert and oriented x3. No focal deficits. Moves all extremities spontaneously. Psych: Normal affect. Responds appropriately.  Labs    Troponin (Point of Care Test) No results for input(s): TROPIPOC in the last 72 hours. Recent Labs    04/04/19 1957 04/04/19 2245 04/05/19 0542  TROPONINI <0.03 <0.03 <0.03   Lab Results  Component Value Date   WBC 6.0 04/04/2019   HGB 14.6 04/04/2019   HCT 44.4 04/04/2019   MCV 88.3 04/04/2019   PLT 229 04/04/2019    Recent Labs  Lab 04/04/19 1957  NA 136  K  3.6  CL 100  CO2 28  BUN 19  CREATININE 1.43*  CALCIUM 8.9  GLUCOSE 119*   Lab Results  Component Value Date   CHOL 156 04/05/2019   HDL 50 04/05/2019   LDLCALC 95 04/05/2019   TRIG 54 04/05/2019   Lab Results  Component Value Date   DDIMER 0.33 04/04/2019     Radiology Studies    Dg Chest 2 View  Result Date: 04/04/2019 CLINICAL DATA:  Intermittent left chest pain since yesterday. EXAM: CHEST - 2 VIEW COMPARISON:  02/14/2011 FINDINGS: The lungs are hypoinflated with extension way shin of the cardiac silhouette and of the bronchovascular markings. No airspace consolidation, edema, pleural effusion, or pneumothorax is identified. No acute osseous abnormality is seen. IMPRESSION: Hypoinflation without evidence of acute airspace disease. Electronically Signed   By: Sebastian AcheAllen  Grady M.D.   On: 04/04/2019 17:58   Ct Angio Chest Pe W And/or Wo Contrast  Result Date: 04/04/2019 CLINICAL DATA:  Intermittent left-sided chest pain since yesterday. No shortness of breath. EXAM: CT ANGIOGRAPHY CHEST WITH CONTRAST TECHNIQUE: Multidetector CT imaging of the chest was performed using the standard protocol during bolus administration of intravenous  contrast. Multiplanar CT image reconstructions and MIPs were obtained to evaluate the vascular anatomy. CONTRAST:  100mL OMNIPAQUE IOHEXOL 350 MG/ML SOLN COMPARISON:  None. FINDINGS: Cardiovascular: The heart is normal in size. No pericardial effusion. The aorta is normal in caliber. No dissection or atherosclerotic calcifications. No definite coronary artery calcifications. The pulmonary arterial tree is fairly well opacified. No filling defects to suggest pulmonary embolism. Mediastinum/Nodes: Scattered mediastinal and hilar lymph nodes but no mass or overt adenopathy. There also scattered bilateral axillary lymph nodes. The esophagus is grossly normal. Lungs/Pleura: The lungs are clear. No infiltrates, edema or effusions. No worrisome pulmonary lesions. No bronchiectasis or interstitial lung disease. Upper Abdomen: No significant upper abdominal findings. Simple appearing 4 cm left renal cyst. Musculoskeletal: No significant bony findings. Moderate degenerative changes involving the lower thoracic spine. Review of the MIP images confirms the above findings. IMPRESSION: 1. No CT findings for pulmonary embolism. 2. Normal thoracic aorta. 3. No acute pulmonary findings or worrisome pulmonary lesions. 4. Scattered mediastinal and hilar lymph nodes but no mass or overt adenopathy. Electronically Signed   By: Rudie MeyerP.  Gallerani M.D.   On: 04/04/2019 21:21    EKG     EKG: EKG was personally reviewed and demonstrates: Normal sinus rhythm, rate 77 bpm, with 1st degree AV block, LVH with likely early repolarization changes, and T wave inversions in leads III and aVF. No prior tracing for comparison.  Telemetry: Telemetry was personally reviewed and demonstrates: Normal sinus rhythm with rates in the 60's to 90's.  Cardiac Imaging    Echo pending.  Assessment & Plan    Chest Pain - Patient presents with intermittent mild chest pain. Patient is from [place]. He speaks [place] but did have trouble answering  some of my questions. From what I can gather chest pain is non-exertional and pleuritic in nature.  - EKG shows T wave inversion in leads III and aVF. No prior tracings for comparison. - Troponin negative x3.  - D-dimer negative. Chest CTA negative for PE. - LDL 95.  - Hemglobin A1c 6.2 consistent with prediabetes.  - Patient is currently chest pain free. - Difficult to gather history due to language barrier. Patient also seemed to down-play his symptoms. Presentation sounds atypical but cardiovascular risk factors include hypertension, race, sex. I think patient would benefit from  a nuclear stress test. Will discuss with MD.  Hypertension - Systolic BP ranging from 120's to 150's. Most recent BP 124/66. - On HCTZ 25mg  daily at home.   Renal Insufficiency - Serum creatinine 1.43. Unsure of baseline.  Leg Pain - Patient reports pain behind bilateral knees with bending of leg. He reports being diagnosed with a DVT while in [place] last year which he did get treatment for. D-dimer negative. Will defer to primary team.  Signed, Corrin Parker, PA-C 04/05/2019, 10:08 AM Pager: [phone] For questions or updates, please contact   Please consult [email or web address] for contact info under Cardiology/STEMI.

## 2019-04-05 NOTE — Progress Notes (Signed)
  Echocardiogram 2D Echocardiogram has been performed.  Jimmy Hamilton L Androw 04/05/2019, 9:47 AM

## 2019-04-05 NOTE — Discharge Summary (Signed)
Physician Discharge Summary  Jimmy Hamilton WUJ:[id number] DOB: May 09, 1956 DOA: 04/04/2019  PCP: Rometta Emery, MD  Admit date: 04/04/2019 Discharge date: 04/05/2019  Admitted From: Home. Disposition: Home  Recommendations for Outpatient Follow-up:  1. Follow up with PCP in 1-2 weeks 2. He will follow-up with cardiology and stress test has been ordered.  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  He does have some posterior knee pain that is chronic.  Initial EKG and troponins were nonischemic.  Repeat EKG and troponins remained nonischemic.  Patient remained without any recurrence of symptoms last 24 hours.  Acute coronary syndrome ruled out. Hemoglobin A1c 6.2, We discussed lifestyle changes and dietary changes. Lipid panel is normal. Acute coronary syndrome ruled out, patient was also seen by cardiology in the hospital, they will arrange a Lexiscan.  Advised to take aspirin 81 mg daily at home.  D-dimer was normal.  CTA of the chest without any evidence of pulmonary embolism or other pathology.  2D echocardiogram was normal with no wall motion abnormalities.  COVID-19 negative.  Discharge Diagnoses:  Active Problems:   Essential hypertension   Chest pain  As above.  Discharge Instructions  Discharge Instructions    Call MD for:   Complete by:  As directed    Any recurrent chest pain.  Leg swelling or shortness of breath.   Diet - low sodium heart healthy   Complete by:  As directed    Discharge instructions   Complete by:   As directed    You will follow-up with cardiology.   Increase activity slowly   Complete by:  As directed      Allergies as of 04/05/2019   No Known Allergies     Medication List    STOP taking these medications   HYDROcodone-acetaminophen 5-325 MG tablet Commonly known as:  NORCO/VICODIN   ibuprofen 800 MG tablet Commonly known as:  ADVIL   naproxen 500 MG tablet Commonly known as:  NAPROSYN     TAKE these medications   aspirin 81 MG EC tablet Take 1 tablet (81 mg total) by mouth daily for 30 days. Start taking on:  April 06, 2019   hydrochlorothiazide 25 MG tablet Commonly known as:  HYDRODIURIL Take 25 mg by mouth daily.   omeprazole 40 MG capsule Commonly known as:  PRILOSEC Take 40 mg by mouth 2 (two) times daily.       No Known Allergies  Consultations:  Cardiology   Procedures/Studies: Dg Chest 2 View  Result Date: 04/04/2019 CLINICAL DATA:  Intermittent left chest pain since yesterday. EXAM: CHEST - 2 VIEW COMPARISON:  02/14/2011 FINDINGS: The lungs are hypoinflated with extension way shin of the cardiac silhouette and of the bronchovascular markings. No airspace consolidation, edema, pleural effusion, or pneumothorax is identified. No acute osseous abnormality is seen. IMPRESSION: Hypoinflation without evidence of acute airspace disease. Electronically Signed   By: Sebastian Ache M.D.   On: 04/04/2019 17:58   Ct Angio Chest Pe W And/or Wo Contrast  Result Date: 04/04/2019 CLINICAL DATA:  Intermittent left-sided chest pain since yesterday. No shortness of breath. EXAM:  CT ANGIOGRAPHY CHEST WITH CONTRAST TECHNIQUE: Multidetector CT imaging of the chest was performed using the standard protocol during bolus administration of intravenous contrast. Multiplanar CT image reconstructions and MIPs were obtained to evaluate the vascular anatomy. CONTRAST:  OMNIPAQUE IOHEXOL 350 MG/ML SOLN COMPARISON:  None. FINDINGS: Cardiovascular: The heart is normal in size. No  pericardial effusion. The aorta is normal in caliber. No dissection or atherosclerotic calcifications. No definite coronary artery calcifications. The pulmonary arterial tree is fairly well opacified. No filling defects to suggest pulmonary embolism. Mediastinum/Nodes: Scattered mediastinal and hilar lymph nodes but no mass or overt adenopathy. There also scattered bilateral axillary lymph nodes. The esophagus is grossly normal. Lungs/Pleura: The lungs are clear. No infiltrates, edema or effusions. No worrisome pulmonary lesions. No bronchiectasis or interstitial lung disease. Upper Abdomen: No significant upper abdominal findings. Simple appearing 4 cm left renal cyst. Musculoskeletal: No significant bony findings. Moderate degenerative changes involving the lower thoracic spine. Review of the MIP images confirms the above findings. IMPRESSION: 1. No CT findings for pulmonary embolism. 2. Normal thoracic aorta. 3. No acute pulmonary findings or worrisome pulmonary lesions. 4. Scattered mediastinal and hilar lymph nodes but no mass or overt adenopathy. Electronically Signed   By: Rudie Meyer M.D.   On: 04/04/2019 21:21       Subjective: Patient seen and examined on the day of discharge.  Was admitted last night with intermittent chest pain that has improved since admission.  Walking around the hallway with no symptoms.   Discharge Exam: Vitals:   04/05/19 0612 04/05/19 1231  BP: 124/66 121/75  Pulse: 65 67  Resp: 14 19  Temp: 97.6 F (36.4 C) 98.8 F (37.1 C)  SpO2: 100% 100%   Vitals:   04/05/19 0219 04/05/19 0226 04/05/19 0612 04/05/19 1231  BP: (!) 141/79  124/66 121/75  Pulse: 66  65 67  Resp: 18  14 19   Temp: 97.7 F (36.5 C)  97.6 F (36.4 C) 98.8 F (37.1 C)  TempSrc: Oral     SpO2: 100%  100% 100%  Weight:  75 kg    Height:  5\' 9"  (1.753 m)      General: Pt is alert, awake, not in acute distress, on room air.  Walking around. Cardiovascular: RRR, S1/S2 +, no rubs, no  gallops Respiratory: CTA bilaterally, no wheezing, no rhonchi Abdominal: Soft, NT, ND, bowel sounds + Extremities: no edema, no cyanosis    The results of significant diagnostics from this hospitalization (including imaging, microbiology, ancillary and laboratory) are listed below for reference.     Microbiology: Recent Results (from the past 240 hour(s))  SARS Coronavirus 2 (CEPHEID - Performed in [facility] hospital lab), Hosp Order     Status: None   Collection Time: 04/04/19 10:48 PM  Result Value Ref Range Status   SARS Coronavirus 2 NEGATIVE NEGATIVE Final    Comment: (NOTE) If result is NEGATIVE SARS-CoV-2 target nucleic acids are NOT DETECTED. The SARS-CoV-2 RNA is generally detectable in upper and lower  respiratory specimens during the acute phase of infection. The lowest  concentration of SARS-CoV-2 viral copies this assay can detect is 250  copies / mL. A negative result does not preclude SARS-CoV-2 infection  and should not be used as the sole basis for treatment or other  patient management decisions.  A negative result may occur with  improper specimen collection / handling, submission of specimen other  than nasopharyngeal swab, presence of viral mutation(s) within the  areas targeted  by this assay, and inadequate number of viral copies  (<250 copies / mL). A negative result must be combined with clinical  observations, patient history, and epidemiological information. If result is POSITIVE SARS-CoV-2 target nucleic acids are DETECTED. The SARS-CoV-2 RNA is generally detectable in upper and lower  respiratory specimens dur ing the acute phase of infection.  Positive  results are indicative of active infection with SARS-CoV-2.  Clinical  correlation with patient history and other diagnostic information is  necessary to determine patient infection status.  Positive results do  not rule out bacterial infection or co-infection with other viruses. If result is  PRESUMPTIVE POSTIVE SARS-CoV-2 nucleic acids MAY BE PRESENT.   A presumptive positive result was obtained on the submitted specimen  and confirmed on repeat testing.  While 2019 novel coronavirus  (SARS-CoV-2) nucleic acids may be present in the submitted sample  additional confirmatory testing may be necessary for epidemiological  and / or clinical management purposes  to differentiate between  SARS-CoV-2 and other Sarbecovirus currently known to infect humans.  If clinically indicated additional testing with an alternate test  methodology [phone]([id number]) is advised. The SARS-CoV-2 RNA is generally  detectable in upper and lower respiratory sp ecimens during the acute  phase of infection. The expected result is Negative. Fact Sheet for Patients:  BoilerBrush.com.cyhttps://www.fda.gov/media/136312/download Fact Sheet for Healthcare Providers: https://pope.com/https://www.fda.gov/media/136313/download This test is not yet approved or cleared by the [place] FDA and has been authorized for detection and/or diagnosis of SARS-CoV-2 by FDA under an Emergency Use Authorization (EUA).  This EUA will remain in effect (meaning this test can be used) for the duration of the COVID-19 declaration under Section 564(b)(1) of the Act, 21 U.S.C. section 360bbb-3(b)(1), unless the authorization is terminated or revoked sooner. [facility]formed at [facility], [address] [place], [place] [address]      Labs: BNP (last 3 results) No results for input(s): BNP in the last 8760 hours. Basic Metabolic Panel: Recent Labs  Lab 04/04/19 1957  NA 136  K 3.6  CL 100  CO2 28  GLUCOSE 119*  BUN 19  CREATININE 1.43*  CALCIUM 8.9   Liver Function Tests: No results for input(s): AST, ALT, ALKPHOS, BILITOT, PROT, ALBUMIN in the last 168 hours. No results for input(s): LIPASE, AMYLASE in the last 168 hours. No results for input(s): AMMONIA in the last 168 hours. CBC: Recent Labs  Lab 04/04/19 1957  WBC 6.0  HGB  14.6  HCT 44.4  MCV 88.3  PLT 229   Cardiac Enzymes: Recent Labs  Lab 04/04/19 1957 04/04/19 2245 04/05/19 0542 04/05/19 1036  TROPONINI <0.03 <0.03 <0.03 <0.03   BNP: Invalid input(s): POCBNP CBG: No results for input(s): GLUCAP in the last 168 hours. D-Dimer Recent Labs    04/04/19 1957  DDIMER 0.33   Hgb A1c Recent Labs    04/05/19 0542  HGBA1C 6.2*   Lipid Profile Recent Labs    04/05/19 0542  CHOL 156  HDL 50  LDLCALC 95  TRIG 54  CHOLHDL 3.1   Thyroid function studies No results for input(s): TSH, T4TOTAL, T3FREE, THYROIDAB in the last 72 hours.  Invalid input(s): FREET3 Anemia work up No results for input(s): VITAMINB12, FOLATE, FERRITIN, TIBC, IRON, RETICCTPCT in the last 72 hours. Urinalysis    Component Value Date/Time   COLORURINE STRAW (A) 04/05/2019 0619   APPEARANCEUR CLEAR 04/05/2019 0619   LABSPEC 1.023 04/05/2019 0619   PHURINE 6.0 04/05/2019 0619   GLUCOSEU NEGATIVE 04/05/2019 [id number]  HGBUR NEGATIVE 04/05/2019 0619   BILIRUBINUR NEGATIVE 04/05/2019 0619   KETONESUR NEGATIVE 04/05/2019 0619   PROTEINUR NEGATIVE 04/05/2019 0619   NITRITE NEGATIVE 04/05/2019 0619   LEUKOCYTESUR NEGATIVE 04/05/2019 1610   Sepsis Labs Invalid input(s): PROCALCITONIN,  WBC,  LACTICIDVEN Microbiology Recent Results (from the past 240 hour(s))  SARS Coronavirus 2 (CEPHEID - Performed in [facility] hospital lab), Hosp Order     Status: None   Collection Time: 04/04/19 10:48 PM  Result Value Ref Range Status   SARS Coronavirus 2 NEGATIVE NEGATIVE Final    Comment: (NOTE) If result is NEGATIVE SARS-CoV-2 target nucleic acids are NOT DETECTED. The SARS-CoV-2 RNA is generally detectable in upper and lower  respiratory specimens during the acute phase of infection. The lowest  concentration of SARS-CoV-2 viral copies this assay can detect is 250  copies / mL. A negative result does not preclude SARS-CoV-2 infection  and should not be used as the sole  basis for treatment or other  patient management decisions.  A negative result may occur with  improper specimen collection / handling, submission of specimen other  than nasopharyngeal swab, presence of viral mutation(s) within the  areas targeted by this assay, and inadequate number of viral copies  (<250 copies / mL). A negative result must be combined with clinical  observations, patient history, and epidemiological information. If result is POSITIVE SARS-CoV-2 target nucleic acids are DETECTED. The SARS-CoV-2 RNA is generally detectable in upper and lower  respiratory specimens dur ing the acute phase of infection.  Positive  results are indicative of active infection with SARS-CoV-2.  Clinical  correlation with patient history and other diagnostic information is  necessary to determine patient infection status.  Positive results do  not rule out bacterial infection or co-infection with other viruses. If result is PRESUMPTIVE POSTIVE SARS-CoV-2 nucleic acids MAY BE PRESENT.   A presumptive positive result was obtained on the submitted specimen  and confirmed on repeat testing.  While 2019 novel coronavirus  (SARS-CoV-2) nucleic acids may be present in the submitted sample  additional confirmatory testing may be necessary for epidemiological  and / or clinical management purposes  to differentiate between  SARS-CoV-2 and other Sarbecovirus currently known to infect humans.  If clinically indicated additional testing with an alternate test  methodology [id number]) is advised. The SARS-CoV-2 RNA is generally  detectable in upper and lower respiratory sp ecimens during the acute  phase of infection. The expected result is Negative. Fact Sheet for Patients:  BoilerBrush.com.cy Fact Sheet for Healthcare Providers: https://pope.com/ This test is not yet approved or cleared by the [place] FDA and has been authorized for detection  and/or diagnosis of SARS-CoV-2 by FDA under an Emergency Use Authorization (EUA).  This EUA will remain in effect (meaning this test can be used) for the duration of the COVID-19 declaration under Section 564(b)(1) of the Act, 21 U.S.C. section 360bbb-3(b)(1), unless the authorization is terminated or revoked sooner. Performed at [facility], [address], [place], [place] [address]      Time coordinating discharge: 25 minutes  SIGNED:   Dorcas Carrow, MD  [facility] 04/05/2019, 4:24 PM Pager [phone]  If 7PM-7AM, please contact night-coverage [email or web address] Password [email or web address]

## 2019-04-05 NOTE — ED Notes (Signed)
ED TO INPATIENT HANDOFF REPORT  ED Nurse Name and Phone #: Hilaria Ota Name/Age/Gender Jimmy Hamilton 63 y.o. male Room/Bed: WA09/WA09  Code Status   Code Status: Not on file  Home/SNF/Other Home Patient oriented to: self, place, time and situation Is this baseline? Yes   Triage Complete: Triage complete  Chief Complaint chest pain  Triage Note Patient c/o intermittent left chest pain since yesterday. Patient denies any SOB, nausea, or diaphoresis.  Patient c/o LLQ pain x 1 week. Patient denies any diarrhea or vomiting.   Allergies No Known Allergies  Level of Care/Admitting Diagnosis ED Disposition    ED Disposition Condition Comment   Admit  Hospital Area: [facility] [[id number]]  Level of Care: Telemetry [5]  Admit to tele based on following criteria: Monitor for Ischemic changes  Covid Evaluation: N/A  Diagnosis: Chest pain [975883]  Admitting Physician: Therisa Doyne [[id number]]  Attending Physician: Therisa Doyne [[id number]]  PT Class (Do Not Modify): Observation [104]  PT Acc Code (Do Not Modify): Observation [10022]       B Medical/Surgery History Past Medical History:  Diagnosis Date  . Hypertension    History reviewed. No pertinent surgical history.   A IV Location/Drains/Wounds Patient Lines/Drains/Airways Status   Active Line/Drains/Airways    Name:   Placement date:   Placement time:   Site:   Days:   Peripheral IV 04/04/19 Right Antecubital   04/04/19    2050    Antecubital   1          Intake/Output Last 24 hours No intake or output data in the 24 hours ending 04/05/19 0155  Labs/Imaging Results for orders placed or performed during the hospital encounter of 04/04/19 (from the past 48 hour(s))  Basic metabolic panel     Status: Abnormal   Collection Time: 04/04/19  7:57 PM  Result Value Ref Range   Sodium 136 135 - 145 mmol/L   Potassium 3.6 3.5 - 5.1 mmol/L   Chloride 100 98 - 111 mmol/L   CO2 28 22 - 32 mmol/L    Glucose, Bld 119 (H) 70 - 99 mg/dL   BUN 19 8 - 23 mg/dL   Creatinine, Ser 2.54 (H) 0.61 - 1.24 mg/dL   Calcium 8.9 8.9 - 98.2 mg/dL   GFR calc non Af Amer 52 (L) >60 mL/min   GFR calc Af Amer >60 >60 mL/min   Anion gap 8 5 - 15    Comment: Performed at [facility], [address], [place], [place] [address]  CBC     Status: None   Collection Time: 04/04/19  7:57 PM  Result Value Ref Range   WBC 6.0 4.0 - 10.5 K/uL   RBC 5.03 4.22 - 5.81 MIL/uL   Hemoglobin 14.6 13.0 - 17.0 g/dL   HCT 30.9 40.7 - 68.0 %   MCV 88.3 80.0 - 100.0 fL   MCH 29.0 26.0 - 34.0 pg   MCHC 32.9 30.0 - 36.0 g/dL   RDW 88.1 10.3 - 15.9 %   Platelets 229 150 - 400 K/uL   nRBC 0.0 0.0 - 0.2 %    Comment: Performed at [facility], [address] [place], [place] [address]  Troponin I - ONCE - STAT     Status: None   Collection Time: 04/04/19  7:57 PM  Result Value Ref Range   Troponin I <0.03 <0.03 ng/mL    Comment: Performed at [facility], [address], [place], [place] [address]  D-dimer, quantitative (not at [facility])     Status: None   Collection Time: 04/04/19  7:57 PM  Result Value Ref Range   D-Dimer, Quant 0.33 0.00 - 0.50 ug/mL-FEU    Comment: (NOTE) At the manufacturer cut-off of 0.50 ug/mL FEU, this assay has been documented to exclude PE with a sensitivity and negative predictive value of 97 to 99%.  At this time, this assay has not been approved by the FDA to exclude DVT/VTE. Results should be correlated with clinical presentation. Performed at [facility], [address], [place], [place] [address]   Troponin I - Now Then Q6H     Status: None   Collection Time: 04/04/19 10:45 PM  Result Value Ref Range   Troponin I <0.03 <0.03 ng/mL    Comment: Performed at [facility], [address], [place], [place] [address]  SARS Coronavirus 2 (CEPHEID - Performed in [facility] hospital lab), Hosp  Order     Status: None   Collection Time: 04/04/19 10:48 PM  Result Value Ref Range   SARS Coronavirus 2 NEGATIVE NEGATIVE    Comment: (NOTE) If result is NEGATIVE SARS-CoV-2 target nucleic acids are NOT DETECTED. The SARS-CoV-2 RNA is generally detectable in upper and lower  respiratory specimens during the acute phase of infection. The lowest  concentration of SARS-CoV-2 viral copies this assay can detect is 250  copies / mL. A negative result does not preclude SARS-CoV-2 infection  and should not be used as the sole basis for treatment or other  patient management decisions.  A negative result may occur with  improper specimen collection / handling, submission of specimen other  than nasopharyngeal swab, presence of viral mutation(s) within the  areas targeted by this assay, and inadequate number of viral copies  (<250 copies / mL). A negative result must be combined with clinical  observations, patient history, and epidemiological information. If result is POSITIVE SARS-CoV-2 target nucleic acids are DETECTED. The SARS-CoV-2 RNA is generally detectable in upper and lower  respiratory specimens dur ing the acute phase of infection.  Positive  results are indicative of active infection with SARS-CoV-2.  Clinical  correlation with patient history and other diagnostic information is  necessary to determine patient infection status.  Positive results do  not rule out bacterial infection or co-infection with other viruses. If result is PRESUMPTIVE POSTIVE SARS-CoV-2 nucleic acids MAY BE PRESENT.   A presumptive positive result was obtained on the submitted specimen  and confirmed on repeat testing.  While 2019 novel coronavirus  (SARS-CoV-2) nucleic acids may be present in the submitted sample  additional confirmatory testing may be necessary for epidemiological  and / or clinical management purposes  to differentiate between  SARS-CoV-2 and other Sarbecovirus currently known to  infect humans.  If clinically indicated additional testing with an alternate test  methodology [phone]) is advised. The SARS-CoV-2 RNA is generally  detectable in upper and lower respiratory sp ecimens during the acute  phase of infection. The expected result is Negative. Fact Sheet for Patients:  BoilerBrush.com.cy Fact Sheet for Healthcare Providers: https://pope.com/ This test is not yet approved or cleared by the [place] FDA and has been authorized for detection and/or diagnosis of SARS-CoV-2 by FDA under an Emergency Use Authorization (EUA).  This EUA will remain in effect (meaning this test can be used) for the duration of the COVID-19 declaration under Section 564(b)(1) of the Act, 21 U.S.C. section 360bbb-3(b)(1),  unless the authorization is terminated or revoked sooner. Performed at [facility], [address] [place] [place], [place] [address]    Dg Chest 2 View  Result Date: 04/04/2019 CLINICAL DATA:  Intermittent left chest pain since yesterday. EXAM: CHEST - 2 VIEW COMPARISON:  02/14/2011 FINDINGS: The lungs are hypoinflated with extension way shin of the cardiac silhouette and of the bronchovascular markings. No airspace consolidation, edema, pleural effusion, or pneumothorax is identified. No acute osseous abnormality is seen. IMPRESSION: Hypoinflation without evidence of acute airspace disease. Electronically Signed   By: Sebastian Ache M.D.   On: 04/04/2019 17:58   Ct Angio Chest Pe W And/or Wo Contrast  Result Date: 04/04/2019 CLINICAL DATA:  Intermittent left-sided chest pain since yesterday. No shortness of breath. EXAM: CT ANGIOGRAPHY CHEST WITH CONTRAST TECHNIQUE: Multidetector CT imaging of the chest was performed using the standard protocol during bolus administration of intravenous contrast. Multiplanar CT image reconstructions and MIPs were obtained to evaluate the vascular anatomy. CONTRAST:   OMNIPAQUE IOHEXOL 350 MG/ML SOLN COMPARISON:  None. FINDINGS: Cardiovascular: The heart is normal in size. No pericardial effusion. The aorta is normal in caliber. No dissection or atherosclerotic calcifications. No definite coronary artery calcifications. The pulmonary arterial tree is fairly well opacified. No filling defects to suggest pulmonary embolism. Mediastinum/Nodes: Scattered mediastinal and hilar lymph nodes but no mass or overt adenopathy. There also scattered bilateral axillary lymph nodes. The esophagus is grossly normal. Lungs/Pleura: The lungs are clear. No infiltrates, edema or effusions. No worrisome pulmonary lesions. No bronchiectasis or interstitial lung disease. Upper Abdomen: No significant upper abdominal findings. Simple appearing 4 cm left renal cyst. Musculoskeletal: No significant bony findings. Moderate degenerative changes involving the lower thoracic spine. Review of the MIP images confirms the above findings. IMPRESSION: 1. No CT findings for pulmonary embolism. 2. Normal thoracic aorta. 3. No acute pulmonary findings or worrisome pulmonary lesions. 4. Scattered mediastinal and hilar lymph nodes but no mass or overt adenopathy. Electronically Signed   By: Rudie Meyer M.D.   On: 04/04/2019 21:21    Pending Labs Unresulted Labs (From admission, onward)    Start     Ordered   04/05/19 0119  Urinalysis, Routine w reflex microscopic  Once,   R     04/05/19 0118   04/04/19 2300  Troponin I - Now Then Q6H  Now then every 6 hours,   R     04/04/19 2219   Signed and Held  HIV antibody (Routine Testing)  Tomorrow morning,   R     Signed and Held   Signed and Held  Lipid panel  Tomorrow morning,   R     Signed and Held   Signed and Held  Hemoglobin A1c  Tomorrow morning,   R     Signed and Held          Vitals/Pain Today's Vitals   04/04/19 2300 04/04/19 2315 04/04/19 2330 04/05/19 0150  BP: 137/81 136/86 (!) 146/101 115/87  Pulse: 66 64 75 64  Resp: Temp:      TempSrc:      SpO2: 100% 100% 100% 100%  Weight:      Height:      PainSc:        Isolation Precautions No active isolations  Medications Medications  sodium chloride flush (NS) 0.9 % injection 3 mL (has no administration in time range)  sodium chloride (PF) 0.9 % injection (has no administration in  time range)  aspirin chewable tablet 324 mg (324 mg Oral Given 04/04/19 2046)  iohexol (OMNIPAQUE) 350 MG/ML injection 100 mL (100 mLs Intravenous Contrast Given 04/04/19 2107)    Mobility walks Low fall risk   Focused Assessments Cardiac Assessment Handoff:    Lab Results  Component Value Date   TROPONINI <0.03 04/04/2019   Lab Results  Component Value Date   DDIMER 0.33 04/04/2019   Does the Patient currently have chest pain? No     R Recommendations: See Admitting Provider Note  Report given to:   Additional Notes: NA

## 2019-04-06 LAB — HIV ANTIBODY (ROUTINE TESTING W REFLEX): HIV Screen 4th Generation wRfx: NONREACTIVE

## 2019-04-08 ENCOUNTER — Inpatient Hospital Stay (HOSPITAL_COMMUNITY): Admission: RE | Admit: 2019-04-08 | Payer: PRIVATE HEALTH INSURANCE | Source: Ambulatory Visit

## 2019-04-14 ENCOUNTER — Telehealth (HOSPITAL_COMMUNITY): Payer: Self-pay | Admitting: *Deleted

## 2019-04-14 NOTE — Telephone Encounter (Signed)
Close encounter 

## 2019-04-15 ENCOUNTER — Ambulatory Visit (HOSPITAL_COMMUNITY)
Admission: RE | Admit: 2019-04-15 | Discharge: 2019-04-15 | Disposition: A | Discharge status: Home / Self Care | Payer: BLUE CROSS/BLUE SHIELD | Source: Ambulatory Visit | Attending: Cardiovascular Disease | Admitting: Cardiovascular Disease

## 2019-04-15 ENCOUNTER — Other Ambulatory Visit: Payer: Self-pay

## 2019-04-15 DIAGNOSIS — R0789 Other chest pain: Secondary | ICD-10-CM | POA: Insufficient documentation

## 2019-04-15 DIAGNOSIS — R079 Chest pain, unspecified: Secondary | ICD-10-CM

## 2019-04-15 MED ORDER — TECHNETIUM TC 99M TETROFOSMIN IV KIT
10.3000 | PACK | Freq: Once | INTRAVENOUS | Status: AC | PRN
  Administered 2019-04-15: 10.3 via INTRAVENOUS
  Filled 2019-04-15: qty 11

## 2019-04-15 MED ORDER — TECHNETIUM TC 99M TETROFOSMIN IV KIT
30.3000 | PACK | Freq: Once | INTRAVENOUS | Status: AC | PRN
  Administered 2019-04-15: 30.3 via INTRAVENOUS
  Filled 2019-04-15: qty 31

## 2019-04-15 MED ORDER — REGADENOSON 0.4 MG/5ML IV SOLN
0.4000 mg | Freq: Once | INTRAVENOUS | Status: AC
  Administered 2019-04-15: 0.4 mg via INTRAVENOUS

## 2019-04-16 LAB — MYOCARDIAL PERFUSION IMAGING
LV dias vol: 120 mL (ref 62–150)
LV sys vol: 61 mL
Peak HR: 88 {beats}/min
Rest HR: 67 {beats}/min
SDS: 0
SRS: 0
SSS: 0
TID: 1.08

## 2019-04-19 ENCOUNTER — Telehealth: Payer: Self-pay | Admitting: Internal Medicine

## 2019-04-19 NOTE — Telephone Encounter (Signed)
Called patient to scheduled 2 week followup regarding myoview, but, patient wants to talk to his PCP first before making appointment.

## 2019-05-23 MED ORDER — SODIUM CHLORIDE (PF) 0.9 % IJ SOLN
INTRAMUSCULAR | Status: AC
  Filled 2019-05-23: qty 50

## 2019-06-22 ENCOUNTER — Other Ambulatory Visit: Payer: Self-pay

## 2019-06-22 ENCOUNTER — Encounter (HOSPITAL_COMMUNITY): Payer: Self-pay | Admitting: Emergency Medicine

## 2019-06-22 ENCOUNTER — Emergency Department (HOSPITAL_COMMUNITY): Payer: BLUE CROSS/BLUE SHIELD

## 2019-06-22 ENCOUNTER — Emergency Department (HOSPITAL_COMMUNITY)
Admission: EM | Admit: 2019-06-22 | Discharge: 2019-06-22 | Disposition: A | Discharge status: Home / Self Care | Payer: BLUE CROSS/BLUE SHIELD | Attending: Emergency Medicine | Admitting: Emergency Medicine

## 2019-06-22 DIAGNOSIS — I10 Essential (primary) hypertension: Secondary | ICD-10-CM | POA: Insufficient documentation

## 2019-06-22 DIAGNOSIS — Z79899 Other long term (current) drug therapy: Secondary | ICD-10-CM | POA: Insufficient documentation

## 2019-06-22 DIAGNOSIS — R1032 Left lower quadrant pain: Secondary | ICD-10-CM | POA: Diagnosis present

## 2019-06-22 DIAGNOSIS — N200 Calculus of kidney: Secondary | ICD-10-CM | POA: Diagnosis not present

## 2019-06-22 HISTORY — DX: Gastro-esophageal reflux disease without esophagitis: K21.9

## 2019-06-22 LAB — COMPREHENSIVE METABOLIC PANEL
ALT: 31 U/L (ref 0–44)
AST: 26 U/L (ref 15–41)
Albumin: 3.8 g/dL (ref 3.5–5.0)
Alkaline Phosphatase: 72 U/L (ref 38–126)
Anion gap: 11 (ref 5–15)
BUN: 12 mg/dL (ref 8–23)
CO2: 24 mmol/L (ref 22–32)
Calcium: 9.1 mg/dL (ref 8.9–10.3)
Chloride: 103 mmol/L (ref 98–111)
Creatinine, Ser: 1.3 mg/dL — ABNORMAL HIGH (ref 0.61–1.24)
GFR calc Af Amer: >60 mL/min (ref >60)
GFR calc non Af Amer: 59 mL/min — ABNORMAL LOW (ref >60)
Glucose, Bld: 133 mg/dL — ABNORMAL HIGH (ref 70–99)
Potassium: 3.2 mmol/L — ABNORMAL LOW (ref 3.5–5.1)
Sodium: 138 mmol/L (ref 135–145)
Total Bilirubin: 0.5 mg/dL (ref 0.3–1.2)
Total Protein: 7.2 g/dL (ref 6.5–8.1)

## 2019-06-22 LAB — CBC WITH DIFFERENTIAL/PLATELET
Abs Immature Granulocytes: 0.02 10*3/uL (ref 0.00–0.07)
Basophils Absolute: 0 10*3/uL (ref 0.0–0.1)
Basophils Relative: 1 %
Eosinophils Absolute: 0.3 10*3/uL (ref 0.0–0.5)
Eosinophils Relative: 4 %
HCT: 42.3 % (ref 39.0–52.0)
Hemoglobin: 14.2 g/dL (ref 13.0–17.0)
Immature Granulocytes: 0 %
Lymphocytes Relative: 42 %
Lymphs Abs: 2.5 10*3/uL (ref 0.7–4.0)
MCH: 28.6 pg (ref 26.0–34.0)
MCHC: 33.6 g/dL (ref 30.0–36.0)
MCV: 85.1 fL (ref 80.0–100.0)
Monocytes Absolute: 0.6 10*3/uL (ref 0.1–1.0)
Monocytes Relative: 10 %
Neutro Abs: 2.5 10*3/uL (ref 1.7–7.7)
Neutrophils Relative %: 43 %
Platelets: 231 10*3/uL (ref 150–400)
RBC: 4.97 MIL/uL (ref 4.22–5.81)
RDW: 12.8 % (ref 11.5–15.5)
WBC: 5.9 10*3/uL (ref 4.0–10.5)
nRBC: 0 % (ref 0.0–0.2)

## 2019-06-22 LAB — URINALYSIS, ROUTINE W REFLEX MICROSCOPIC
Bacteria, UA: NONE SEEN
Bilirubin Urine: NEGATIVE
Glucose, UA: NEGATIVE mg/dL
Ketones, ur: NEGATIVE mg/dL
Leukocytes,Ua: NEGATIVE
Nitrite: NEGATIVE
Protein, ur: NEGATIVE mg/dL
Specific Gravity, Urine: 1.009 (ref 1.005–1.030)
pH: 7 (ref 5.0–8.0)

## 2019-06-22 LAB — LIPASE, BLOOD: Lipase: 26 U/L (ref 11–51)

## 2019-06-22 MED ORDER — HYDROMORPHONE HCL 1 MG/ML IJ SOLN
1.0000 mg | Freq: Once | INTRAMUSCULAR | Status: AC
  Administered 2019-06-22: 1 mg via INTRAVENOUS
  Filled 2019-06-22: qty 1

## 2019-06-22 MED ORDER — ONDANSETRON HCL 4 MG/2ML IJ SOLN
4.0000 mg | Freq: Once | INTRAMUSCULAR | Status: AC
  Administered 2019-06-22: 4 mg via INTRAVENOUS
  Filled 2019-06-22: qty 2

## 2019-06-22 MED ORDER — HYDROCODONE-ACETAMINOPHEN 5-325 MG PO TABS: 1.0000 | ORAL_TABLET | Freq: Four times a day (QID) | ORAL | 0 refills | Status: DC | PRN

## 2019-06-22 MED ORDER — IBUPROFEN 600 MG PO TABS: 600.0000 mg | ORAL_TABLET | Freq: Four times a day (QID) | ORAL | 0 refills | Status: DC | PRN

## 2019-06-22 NOTE — ED Triage Notes (Signed)
Pt BIB [facility] from home. Pt complaining of sudden onset left sided abdominal pain that began at 0500 this morning when he woke up to urinate. Pt states he feels like he needs to urinate but cannot. Pt has history of HTN and GERD. VSS. A&Ox4.

## 2019-06-22 NOTE — ED Notes (Addendum)
Pt given urinal and made aware that a urine specimen is needed. Pt verbalized understanding.

## 2019-06-22 NOTE — ED Provider Notes (Signed)
[place] EMERGENCY DEPARTMENT Provider Note   CSN: [id number] Arrival date & time: 06/22/19  0757    History   Chief Complaint Chief Complaint  Patient presents with   Abdominal Pain    HPI Jimmy Hamilton is a 63 y.o. male.     HPI Patient has sudden onset of left lower abdominal pain with radiation into the back and flank area.  Also some radiation to the left testicle.  It came on acutely this morning.  No symptoms yesterday.  Patient has not been having pain burning urgency with urination.  He denies he has had fevers or chills.  No history of similar pain.  No known history of kidney stone.  Patient is otherwise healthy. Past Medical History:  Diagnosis Date   GERD (gastroesophageal reflux disease)    Hypertension     Patient Active Problem List   Diagnosis Date Noted   Chest pain 04/05/2019   Essential hypertension 04/04/2019    History reviewed. No pertinent surgical history.      Home Medications    Prior to Admission medications   Medication Sig Start Date End Date Taking? Authorizing Provider  betamethasone dipropionate (DIPROLENE) 0.05 % cream Apply 1 application topically 2 (two) times daily. On hand 06/14/19  Yes [provider]  hydrochlorothiazide (HYDRODIURIL) 25 MG tablet Take 25 mg by mouth daily.   Yes [provider]  ibuprofen (ADVIL) 800 MG tablet Take 800 mg by mouth daily. 05/30/19  Yes [provider]  HYDROcodone-acetaminophen (NORCO/VICODIN) 5-325 MG tablet Take 1-2 tablets by mouth every 6 (six) hours as needed for moderate pain or severe pain. 06/22/19   Charlesetta Shanks, MD  ibuprofen (ADVIL) 600 MG tablet Take 1 tablet (600 mg total) by mouth every 6 (six) hours as needed. 06/22/19   Charlesetta Shanks, MD    Family History Family History  Problem Relation Age of Onset   Diabetes Brother    CAD Neg Hx    Cancer Neg Hx     Social History Social History   Tobacco Use   Smoking  status: Never Smoker   Smokeless tobacco: Never Used  Substance Use Topics   Alcohol use: No   Drug use: No     Allergies   Patient has no known allergies.   Review of Systems Review of Systems 10 Systems reviewed and are negative for acute change except as noted in the HPI.   Physical Exam Updated Vital Signs BP 124/76 (BP Location: Right Arm)    Pulse 62    Temp (!) 97.4 F (36.3 C) (Oral)    Resp 17    SpO2 98%   Physical Exam Constitutional:      Comments: Patient is alert and nontoxic.  She does appear to be in severe pain.  No respiratory distress.  Otherwise clinically well.  HENT:     Head: Normocephalic and atraumatic.  Eyes:     Extraocular Movements: Extraocular movements intact.  Cardiovascular:     Rate and Rhythm: Normal rate and regular rhythm.  Pulmonary:     Effort: Pulmonary effort is normal.     Breath sounds: Normal breath sounds.  Abdominal:     General: There is no distension.     Palpations: Abdomen is soft.     Tenderness: There is no abdominal tenderness. There is no guarding.  Genitourinary:    Comments: No groin or inguinal swelling.  Penis normal.  No scrotal swelling.  Mild tenderness of the left  testicle. Musculoskeletal: Normal range of motion.        General: No swelling or tenderness.     Right lower leg: No edema.     Left lower leg: No edema.  Skin:    General: Skin is warm and dry.  Neurological:     General: No focal deficit present.     Mental Status: He is oriented to person, place, and time.     Coordination: Coordination normal.      ED Treatments / Results  Labs (all labs ordered are listed, but only abnormal results are displayed) Labs Reviewed  URINALYSIS, ROUTINE W REFLEX MICROSCOPIC - Abnormal; Notable for the following components:      Result Value   Hgb urine dipstick SMALL (*)    All other components within normal limits  COMPREHENSIVE METABOLIC PANEL - Abnormal; Notable for the following components:    Potassium 3.2 (*)    Glucose, Bld 133 (*)    Creatinine, Ser 1.30 (*)    GFR calc non Af Amer 59 (*)    All other components within normal limits  LIPASE, BLOOD  CBC WITH DIFFERENTIAL/PLATELET    EKG None  Radiology Ct Renal Stone Study  Result Date: 06/22/2019 CLINICAL DATA:  Flank pain EXAM: CT ABDOMEN AND PELVIS WITHOUT CONTRAST TECHNIQUE: Multidetector CT imaging of the abdomen and pelvis was performed following the standard protocol without IV contrast. COMPARISON:  06/05/2011 FINDINGS: Lower chest: Mild dependent scarring is noted in the right lower lobe. Hepatobiliary: No focal liver abnormality is seen. No gallstones, gallbladder wall thickening, or biliary dilatation. Pancreas: Unremarkable. No pancreatic ductal dilatation or surrounding inflammatory changes. Spleen: Normal in size without focal abnormality. Adrenals/Urinary Tract: Adrenal glands are within normal limits bilaterally. The right kidney is well visualized with a tiny nonobstructing stone identified in the lower pole. A lower pole cyst is also seen slightly larger than that noted on prior exam now measuring approximately 2.5 cm. Left kidney demonstrates increased perinephric stranding as well as a prominent cyst posteriorly which measures 4.4 cm in greatest dimension. Nonobstructing lower pole renal calculi are noted. The largest of these measures 6 mm in dimension. The collecting system on the left is within normal limits. Within the dependent portion of the bladder there is a 5-6 mm stone identified. These changes are consistent with a recently passed stone. Stomach/Bowel: The colon is predominately decompressed. No obstructive or inflammatory changes are seen. The appendix is unremarkable. No small bowel or gastric abnormality is seen. Vascular/Lymphatic: No significant vascular findings are present. No enlarged abdominal or pelvic lymph nodes. Reproductive: Prostate is unremarkable. Other: Fat containing umbilical hernia is  noted and stable. Musculoskeletal: No acute or significant osseous findings. IMPRESSION: Bilateral nonobstructing renal calculi as described. Changes consistent with the recently passed stone on the left with perinephric stranding and a dependent stone within the bladder measuring 6 mm. Bilateral renal cysts. Electronically Signed   By: Alcide CleverMark  Lukens M.D.   On: 06/22/2019 09:04    Procedures Procedures (including critical care time)  Medications Ordered in ED Medications  HYDROmorphone (DILAUDID) injection 1 mg (1 mg Intravenous Given 06/22/19 0809)  ondansetron (ZOFRAN) injection 4 mg (4 mg Intravenous Given 06/22/19 0810)     Initial Impression / Assessment and Plan / ED Course  I have reviewed the triage vital signs and the nursing notes.  Pertinent labs & imaging results that were available during my care of the patient were reviewed by me and considered in my medical decision making (  see chart for details).  Clinical Course as of Jun 21 1206  Wed Jun 22, 2019  1106 Patient is now comfortable.  He is updated on findings.  Still awaiting a urinalysis to make final disposition.  Plan to discharge as soon as urinalysis is returned.   [MP]    Clinical Course User Index [MP] Arby BarrettePfeiffer, Aidenn Skellenger, MD       Patient presented with presentation typical for kidney stone.  CT shows a recently passed stone in the bladder with a little bit of ureteral inflammatory change consistent with passed stone.  Urinalysis is negative for infection.  Symptoms were sudden onset.  Patient is now comfortable.  Patient updated on findings and follow-up with urology.  Final Clinical Impressions(s) / ED Diagnoses   Final diagnoses:  Kidney stone    ED Discharge Orders         Ordered    ibuprofen (ADVIL) 600 MG tablet  Every 6 hours PRN     06/22/19 1158    HYDROcodone-acetaminophen (NORCO/VICODIN) 5-325 MG tablet  Every 6 hours PRN     06/22/19 1158           Arby BarrettePfeiffer, Aissatou Fronczak, MD 06/22/19 1208

## 2019-06-22 NOTE — ED Notes (Signed)
Patient transported to CT 

## 2019-06-22 NOTE — ED Notes (Signed)
Bladder scan showed 203 ml.

## 2019-10-04 ENCOUNTER — Other Ambulatory Visit: Payer: Self-pay | Admitting: Gastroenterology

## 2019-10-04 ENCOUNTER — Other Ambulatory Visit (HOSPITAL_COMMUNITY): Payer: Self-pay | Admitting: Gastroenterology

## 2019-10-04 DIAGNOSIS — R1011 Right upper quadrant pain: Secondary | ICD-10-CM

## 2019-10-13 ENCOUNTER — Ambulatory Visit (HOSPITAL_COMMUNITY)
Admission: RE | Admit: 2019-10-13 | Discharge: 2019-10-13 | Disposition: A | Discharge status: Home / Self Care | Payer: BLUE CROSS/BLUE SHIELD | Source: Ambulatory Visit | Attending: Gastroenterology | Admitting: Gastroenterology

## 2019-10-13 ENCOUNTER — Encounter (HOSPITAL_COMMUNITY)
Admission: RE | Admit: 2019-10-13 | Discharge: 2019-10-13 | Disposition: A | Discharge status: Home / Self Care | Payer: BLUE CROSS/BLUE SHIELD | Source: Ambulatory Visit | Attending: Gastroenterology | Admitting: Gastroenterology

## 2019-10-13 ENCOUNTER — Other Ambulatory Visit: Payer: Self-pay

## 2019-10-13 DIAGNOSIS — R1011 Right upper quadrant pain: Secondary | ICD-10-CM

## 2019-10-13 MED ORDER — TECHNETIUM TC 99M MEBROFENIN IV KIT
5.1600 | PACK | Freq: Once | INTRAVENOUS | Status: AC | PRN
  Administered 2019-10-13: 10:00:00 5.16 via INTRAVENOUS

## 2019-11-23 ENCOUNTER — Other Ambulatory Visit: Payer: Self-pay | Admitting: Surgery

## 2019-11-23 ENCOUNTER — Emergency Department (HOSPITAL_COMMUNITY)
Admission: EM | Admit: 2019-11-23 | Discharge: 2019-11-23 | Discharge status: Absent without leave | Payer: BLUE CROSS/BLUE SHIELD

## 2019-11-30 ENCOUNTER — Other Ambulatory Visit: Payer: Self-pay

## 2019-11-30 ENCOUNTER — Encounter (HOSPITAL_BASED_OUTPATIENT_CLINIC_OR_DEPARTMENT_OTHER): Payer: Self-pay | Admitting: Surgery

## 2019-12-02 ENCOUNTER — Other Ambulatory Visit: Payer: Self-pay

## 2019-12-02 ENCOUNTER — Encounter (HOSPITAL_BASED_OUTPATIENT_CLINIC_OR_DEPARTMENT_OTHER)
Admission: RE | Admit: 2019-12-02 | Discharge: 2019-12-02 | Disposition: A | Discharge status: Home / Self Care | Payer: Self-pay | Source: Ambulatory Visit | Attending: Surgery | Admitting: Surgery

## 2019-12-02 DIAGNOSIS — Z01812 Encounter for preprocedural laboratory examination: Secondary | ICD-10-CM | POA: Insufficient documentation

## 2019-12-02 LAB — BASIC METABOLIC PANEL
Anion gap: 10 (ref 5–15)
BUN: 14 mg/dL (ref 8–23)
CO2: 27 mmol/L (ref 22–32)
Calcium: 9.7 mg/dL (ref 8.9–10.3)
Chloride: 103 mmol/L (ref 98–111)
Creatinine, Ser: 1.27 mg/dL — ABNORMAL HIGH (ref 0.61–1.24)
GFR calc Af Amer: >60 mL/min (ref >60)
GFR calc non Af Amer: >60 mL/min (ref >60)
Glucose, Bld: 200 mg/dL — ABNORMAL HIGH (ref 70–99)
Potassium: 3.7 mmol/L (ref 3.5–5.1)
Sodium: 140 mmol/L (ref 135–145)

## 2019-12-02 NOTE — Progress Notes (Signed)

## 2019-12-03 ENCOUNTER — Other Ambulatory Visit (HOSPITAL_COMMUNITY)
Admission: RE | Admit: 2019-12-03 | Discharge: 2019-12-03 | Disposition: A | Discharge status: Home / Self Care | Payer: HRSA Program | Source: Ambulatory Visit | Attending: Surgery | Admitting: Surgery

## 2019-12-03 DIAGNOSIS — Z20822 Contact with and (suspected) exposure to covid-19: Secondary | ICD-10-CM | POA: Insufficient documentation

## 2019-12-03 LAB — SARS CORONAVIRUS 2 (TAT 6-24 HRS): SARS Coronavirus 2: NEGATIVE

## 2019-12-06 NOTE — H&P (Signed)
Jimmy Hamilton Documented: 11/23/2019 10:31 AM Location: Jimmy [facility] Patient #: [id number] DOB: Feb 15, 1956 Married / Language: English / Race: Black or African American Male   History of Present Illness (Jimmy Hamilton A. Jimmy Linden Jimmy Hamilton; 11/23/2019 10:47 AM) The patient is a 64 year old male who presents with abdominal pain. This is a 64 year old gentleman referred by Dr. Collene Hamilton for evaluation of biliary dyskinesia. He has been having right upper quadrant abdominal pain after fatty meals for several years. He will occasionally have pain in the chest. He even had a cardiac workup which was negative regarding this. The pain to be moderate at times. Bowel movements are normal. There is been no emesis. He also has been having left groin pain. He had an ultrasound which was unremarkable. He has had previous kidney stones on CT scan. He had a HIDA scan showing a 26% gallbladder ejection fraction.   Past Surgical History Jimmy Hamilton, Jimmy Hamilton; 11/23/2019 10:31 AM) No pertinent past surgical history   Diagnostic Studies History Jimmy Hamilton, Jimmy Hamilton; 11/23/2019 10:31 AM) Colonoscopy  within last year  Allergies Jimmy Hamilton, Jimmy Hamilton; 11/23/2019 10:32 AM) No Known Drug Allergies [11/23/2019]: Allergies Reconciled   Medication History Jimmy Hamilton, Jimmy Hamilton; 11/23/2019 10:32 AM) hydroCHLOROthiazide (25MG  Tablet, Oral) Active. Medications Reconciled  Social History Jimmy Hamilton, Jimmy Hamilton; 11/23/2019 10:31 AM) Caffeine use  Carbonated beverages, Coffee, Tea. No alcohol use  No drug use  Tobacco use  Never smoker.  Family History Jimmy Hamilton, Jimmy Hamilton; 11/23/2019 10:31 AM) Diabetes Mellitus  Brother.  Other Problems Jimmy Hamilton, Jimmy Hamilton; 11/23/2019 10:31 AM) Back Pain  Cholelithiasis  High blood pressure  Kidney Stone     Review of Systems Jimmy Hamilton Jimmy Hamilton; 11/23/2019 10:31 AM) General Not Present- Appetite Loss, Chills, Fatigue, Fever, Night Sweats, Weight Gain and  Weight Loss. Skin Not Present- Change in Wart/Mole, Dryness, Hives, Jaundice, New Lesions, Non-Healing Wounds, Rash and Ulcer. HEENT Present- Seasonal Allergies. Not Present- Earache, Hearing Loss, Hoarseness, Nose Bleed, Oral Ulcers, Ringing in the Ears, Sinus Pain, Sore Throat, Visual Disturbances, Wears glasses/contact lenses and Yellow Eyes. Respiratory Not Present- Bloody sputum, Chronic Cough, Difficulty Breathing, Snoring and Wheezing. Breast Not Present- Breast Mass, Breast Pain, Nipple Discharge and Skin Changes. Cardiovascular Not Present- Chest Pain, Difficulty Breathing Lying Down, Leg Cramps, Palpitations, Rapid Heart Rate, Shortness of Breath and Swelling of Extremities. Gastrointestinal Not Present- Abdominal Pain, Bloating, Bloody Stool, Change in Bowel Habits, Chronic diarrhea, Constipation, Difficulty Swallowing, Excessive gas, Gets full quickly at meals, Hemorrhoids, Indigestion, Nausea, Rectal Pain and Vomiting. Male Genitourinary Not Present- Blood in Urine, Change in Urinary Stream, Frequency, Impotence, Nocturia, Painful Urination, Urgency and Urine Leakage. Musculoskeletal Present- Back Pain. Not Present- Joint Pain, Joint Stiffness, Muscle Pain, Muscle Weakness and Swelling of Extremities. Neurological Not Present- Decreased Memory, Fainting, Headaches, Numbness, Seizures, Tingling, Tremor, Trouble walking and Weakness. Psychiatric Not Present- Anxiety, Bipolar, Change in Sleep Pattern, Depression, Fearful and Frequent crying. Endocrine Not Present- Cold Intolerance, Excessive Hunger, Hair Changes, Heat Intolerance, Hot flashes and New Diabetes. Hematology Present- Blood Thinners. Not Present- Easy Bruising, Excessive bleeding, Gland problems, HIV and Persistent Infections.  Vitals Jimmy Hamilton Jimmy Hamilton; 11/23/2019 10:32 AM) 11/23/2019 10:31 AM Weight: 232.4 lb Height: 71in Body Surface Area: 2.25 m Body Mass Index: 32.41 kg/m  Temp.: 98.16F  Pulse: 91 (Regular)   BP: 134/84 (Sitting, Left Arm, Standard)       Physical Exam (Jimmy Hamilton A. Jimmy Linden Jimmy Hamilton; 11/23/2019 10:47 AM) The physical exam findings are as follows: Note:On exam, he is well in appearance His  abdomen is obese. There is an easily reducible umbilical hernia and left inguinal hernia. There is some right upper quadrant abdominal tenderness  I reviewed all his x-ray imaging and notes from his gastroenterologist and cardiologist    Assessment & Plan (Jimmy Hamilton A. Jimmy Ivan Jimmy Hamilton; 11/23/2019 10:49 AM) BILIARY DYSKINESIA (K82.8) Impression: I had a long discussion with the patient regarding gallbladder disease and hernias. We discussed use of mesh with hernia Hamilton. He would like to have the gallbladder Hamilton first given his symptoms. I discussed the procedure in detail. The patient was given Agricultural engineer. We discussed the risks and benefits of a laparoscopic cholecystectomy and possible cholangiogram including, but not limited to, bleeding, infection, injury to surrounding structures such as the intestine or liver, bile leak, retained gallstones, need to convert to an open procedure, prolonged diarrhea, blood clots such as DVT, common bile duct injury, anesthesia risks, and possible need for additional procedures. The likelihood of improvement in symptoms and return to the patient's normal status is good. We discussed the typical post-operative recovery course. All questions were answered. We will repair his hernias at a later date.  This patient encounter took 30 minutes today to perform the following: take history, perform exam, review outside records, interpret imaging, counsel the patient on their diagnosis and document encounter, findings & plan in the EHR UMBILICAL HERNIA (K42.9) LEFT INGUINAL HERNIA (K40.90)

## 2019-12-07 ENCOUNTER — Encounter (HOSPITAL_BASED_OUTPATIENT_CLINIC_OR_DEPARTMENT_OTHER)
Admission: RE | Disposition: A | Discharge status: Home / Self Care | Payer: Self-pay | Source: Home / Self Care | Attending: Surgery

## 2019-12-07 ENCOUNTER — Ambulatory Visit (HOSPITAL_BASED_OUTPATIENT_CLINIC_OR_DEPARTMENT_OTHER): Payer: Self-pay | Admitting: Anesthesiology

## 2019-12-07 ENCOUNTER — Ambulatory Visit (HOSPITAL_BASED_OUTPATIENT_CLINIC_OR_DEPARTMENT_OTHER)
Admission: RE | Admit: 2019-12-07 | Discharge: 2019-12-07 | Disposition: A | Discharge status: Home / Self Care | Payer: Self-pay | Attending: Surgery | Admitting: Surgery

## 2019-12-07 ENCOUNTER — Other Ambulatory Visit: Payer: Self-pay

## 2019-12-07 ENCOUNTER — Encounter (HOSPITAL_BASED_OUTPATIENT_CLINIC_OR_DEPARTMENT_OTHER): Payer: Self-pay | Admitting: Surgery

## 2019-12-07 DIAGNOSIS — K819 Cholecystitis, unspecified: Secondary | ICD-10-CM | POA: Insufficient documentation

## 2019-12-07 DIAGNOSIS — I10 Essential (primary) hypertension: Secondary | ICD-10-CM | POA: Insufficient documentation

## 2019-12-07 DIAGNOSIS — Z79899 Other long term (current) drug therapy: Secondary | ICD-10-CM | POA: Insufficient documentation

## 2019-12-07 HISTORY — DX: Calculus of kidney: N20.0

## 2019-12-07 HISTORY — PX: CHOLECYSTECTOMY: SHX55

## 2019-12-07 SURGERY — LAPAROSCOPIC CHOLECYSTECTOMY
Anesthesia: General | Site: Abdomen

## 2019-12-07 MED ORDER — DEXAMETHASONE SODIUM PHOSPHATE 10 MG/ML IJ SOLN
INTRAMUSCULAR | Status: DC | PRN
  Administered 2019-12-07: 4 mg via INTRAVENOUS

## 2019-12-07 MED ORDER — MEPERIDINE HCL 25 MG/ML IJ SOLN: 6.2500 mg | INTRAMUSCULAR | Status: DC | PRN

## 2019-12-07 MED ORDER — OXYCODONE HCL 5 MG PO TABS: 5.0000 mg | ORAL_TABLET | Freq: Four times a day (QID) | ORAL | 0 refills | Status: DC | PRN

## 2019-12-07 MED ORDER — CELECOXIB 200 MG PO CAPS
ORAL_CAPSULE | ORAL | Status: AC
  Filled 2019-12-07: qty 1

## 2019-12-07 MED ORDER — DEXTROSE 5 % IV SOLN
3.0000 g | INTRAVENOUS | Status: AC
  Administered 2019-12-07: 10:00:00 2 g via INTRAVENOUS

## 2019-12-07 MED ORDER — CEFAZOLIN SODIUM-DEXTROSE 2-4 GM/100ML-% IV SOLN
INTRAVENOUS | Status: AC
  Filled 2019-12-07: qty 100

## 2019-12-07 MED ORDER — ROCURONIUM BROMIDE 10 MG/ML (PF) SYRINGE
PREFILLED_SYRINGE | INTRAVENOUS | Status: AC
  Filled 2019-12-07: qty 20

## 2019-12-07 MED ORDER — MIDAZOLAM HCL 2 MG/2ML IJ SOLN
INTRAMUSCULAR | Status: AC
  Filled 2019-12-07: qty 2

## 2019-12-07 MED ORDER — ROCURONIUM BROMIDE 10 MG/ML (PF) SYRINGE
PREFILLED_SYRINGE | INTRAVENOUS | Status: DC | PRN
  Administered 2019-12-07: 80 mg via INTRAVENOUS

## 2019-12-07 MED ORDER — ONDANSETRON HCL 4 MG/2ML IJ SOLN
INTRAMUSCULAR | Status: AC
  Filled 2019-12-07: qty 2

## 2019-12-07 MED ORDER — ACETAMINOPHEN 500 MG PO TABS
ORAL_TABLET | ORAL | Status: AC
  Filled 2019-12-07: qty 2

## 2019-12-07 MED ORDER — HYDROMORPHONE HCL 1 MG/ML IJ SOLN
INTRAMUSCULAR | Status: AC
  Filled 2019-12-07: qty 0.5

## 2019-12-07 MED ORDER — GABAPENTIN 300 MG PO CAPS
300.0000 mg | ORAL_CAPSULE | ORAL | Status: AC
  Administered 2019-12-07: 09:00:00 300 mg via ORAL

## 2019-12-07 MED ORDER — FENTANYL CITRATE (PF) 100 MCG/2ML IJ SOLN: 50.0000 ug | INTRAMUSCULAR | Status: DC | PRN

## 2019-12-07 MED ORDER — CHLORHEXIDINE GLUCONATE CLOTH 2 % EX PADS: 6.0000 | MEDICATED_PAD | Freq: Once | CUTANEOUS | Status: DC

## 2019-12-07 MED ORDER — ONDANSETRON HCL 4 MG/2ML IJ SOLN: 4.0000 mg | Freq: Once | INTRAMUSCULAR | Status: DC | PRN

## 2019-12-07 MED ORDER — FENTANYL CITRATE (PF) 100 MCG/2ML IJ SOLN
INTRAMUSCULAR | Status: AC
  Filled 2019-12-07: qty 2

## 2019-12-07 MED ORDER — MIDAZOLAM HCL 2 MG/2ML IJ SOLN: 1.0000 mg | INTRAMUSCULAR | Status: DC | PRN

## 2019-12-07 MED ORDER — MIDAZOLAM HCL 5 MG/5ML IJ SOLN
INTRAMUSCULAR | Status: DC | PRN
  Administered 2019-12-07: 2 mg via INTRAVENOUS

## 2019-12-07 MED ORDER — LIDOCAINE 2% (20 MG/ML) 5 ML SYRINGE
INTRAMUSCULAR | Status: AC
  Filled 2019-12-07: qty 5

## 2019-12-07 MED ORDER — ONDANSETRON HCL 4 MG/2ML IJ SOLN
INTRAMUSCULAR | Status: DC | PRN
  Administered 2019-12-07: 4 mg via INTRAVENOUS

## 2019-12-07 MED ORDER — GABAPENTIN 300 MG PO CAPS
ORAL_CAPSULE | ORAL | Status: AC
  Filled 2019-12-07: qty 1

## 2019-12-07 MED ORDER — CELECOXIB 200 MG PO CAPS
200.0000 mg | ORAL_CAPSULE | ORAL | Status: AC
  Administered 2019-12-07: 200 mg via ORAL

## 2019-12-07 MED ORDER — LACTATED RINGERS IV SOLN: INTRAVENOUS | Status: DC

## 2019-12-07 MED ORDER — BUPIVACAINE HCL (PF) 0.5 % IJ SOLN
INTRAMUSCULAR | Status: DC | PRN
  Administered 2019-12-07: 20 mL

## 2019-12-07 MED ORDER — LIDOCAINE 2% (20 MG/ML) 5 ML SYRINGE
INTRAMUSCULAR | Status: DC | PRN
  Administered 2019-12-07: 100 mg via INTRAVENOUS

## 2019-12-07 MED ORDER — PROPOFOL 10 MG/ML IV BOLUS
INTRAVENOUS | Status: DC | PRN
  Administered 2019-12-07: 120 mg via INTRAVENOUS

## 2019-12-07 MED ORDER — ACETAMINOPHEN 500 MG PO TABS
1000.0000 mg | ORAL_TABLET | ORAL | Status: AC
  Administered 2019-12-07: 1000 mg via ORAL

## 2019-12-07 MED ORDER — SODIUM CHLORIDE 0.9 % IR SOLN
Status: DC | PRN
  Administered 2019-12-07: 1000 mL

## 2019-12-07 MED ORDER — SUGAMMADEX SODIUM 200 MG/2ML IV SOLN
INTRAVENOUS | Status: DC | PRN
  Administered 2019-12-07: 400 mg via INTRAVENOUS

## 2019-12-07 MED ORDER — FENTANYL CITRATE (PF) 250 MCG/5ML IJ SOLN
INTRAMUSCULAR | Status: DC | PRN
  Administered 2019-12-07: 100 ug via INTRAVENOUS

## 2019-12-07 MED ORDER — HYDROMORPHONE HCL 1 MG/ML IJ SOLN
0.2500 mg | INTRAMUSCULAR | Status: DC | PRN
  Administered 2019-12-07: 0.5 mg via INTRAVENOUS

## 2019-12-07 MED ORDER — PROPOFOL 10 MG/ML IV BOLUS
INTRAVENOUS | Status: AC
  Filled 2019-12-07: qty 20

## 2019-12-07 SURGICAL SUPPLY — 39 items
ADH SKN CLS APL DERMABOND .7 (GAUZE/BANDAGES/DRESSINGS) ×1
APL PRP STRL LF DISP 70% ISPRP (MISCELLANEOUS) ×1
APPLIER CLIP 5 13 M/L LIGAMAX5 (MISCELLANEOUS) ×2
APR CLP MED LRG 5 ANG JAW (MISCELLANEOUS) ×1
BAG SPEC RTRVL LRG 6X4 10 (ENDOMECHANICALS) ×1
BLADE CLIPPER SURG (BLADE) IMPLANT
CHLORAPREP W/TINT 26 (MISCELLANEOUS) ×2 IMPLANT
CLIP APPLIE 5 13 M/L LIGAMAX5 (MISCELLANEOUS) ×1 IMPLANT
COVER MAYO STAND STRL (DRAPES) IMPLANT
COVER WAND RF STERILE (DRAPES) IMPLANT
DECANTER SPIKE VIAL GLASS SM (MISCELLANEOUS) IMPLANT
DERMABOND ADVANCED (GAUZE/BANDAGES/DRESSINGS) ×1
DERMABOND ADVANCED .7 DNX12 (GAUZE/BANDAGES/DRESSINGS) ×1 IMPLANT
DRAPE C-ARM 42X72 X-RAY (DRAPES) IMPLANT
DRAPE LAPAROSCOPIC ABDOMINAL (DRAPES) ×2 IMPLANT
ELECT REM PT RETURN 9FT ADLT (ELECTROSURGICAL) ×2
ELECTRODE REM PT RTRN 9FT ADLT (ELECTROSURGICAL) ×1 IMPLANT
GLOVE SURG SIGNA 7.5 PF LTX (GLOVE) ×2 IMPLANT
GOWN STRL REUS W/ TWL LRG LVL3 (GOWN DISPOSABLE) ×2 IMPLANT
GOWN STRL REUS W/ TWL XL LVL3 (GOWN DISPOSABLE) ×1 IMPLANT
GOWN STRL REUS W/TWL LRG LVL3 (GOWN DISPOSABLE) ×4
GOWN STRL REUS W/TWL XL LVL3 (GOWN DISPOSABLE) ×2
NS IRRIG 1000ML POUR BTL (IV SOLUTION) IMPLANT
PACK BASIN DAY SURGERY FS (CUSTOM PROCEDURE TRAY) ×2 IMPLANT
POUCH SPECIMEN RETRIEVAL 10MM (ENDOMECHANICALS) ×2 IMPLANT
SCISSORS LAP 5X35 DISP (ENDOMECHANICALS) ×2 IMPLANT
SET CHOLANGIOGRAPH 5 50 .035 (SET/KITS/TRAYS/PACK) IMPLANT
SET IRRIG TUBING LAPAROSCOPIC (IRRIGATION / IRRIGATOR) ×2 IMPLANT
SET TUBE SMOKE EVAC HIGH FLOW (TUBING) ×2 IMPLANT
SLEEVE ENDOPATH XCEL 5M (ENDOMECHANICALS) ×4 IMPLANT
SLEEVE SCD COMPRESS KNEE MED (MISCELLANEOUS) ×2 IMPLANT
SPECIMEN JAR SMALL (MISCELLANEOUS) ×2 IMPLANT
SUT MON AB 4-0 PC3 18 (SUTURE) ×2 IMPLANT
SUT PROLENE 1 CT (SUTURE) ×2 IMPLANT
TOWEL GREEN STERILE FF (TOWEL DISPOSABLE) ×2 IMPLANT
TRAY LAPAROSCOPIC (CUSTOM PROCEDURE TRAY) ×2 IMPLANT
TROCAR XCEL BLUNT TIP 100MML (ENDOMECHANICALS) ×2 IMPLANT
TROCAR XCEL NON-BLD 5MMX100MML (ENDOMECHANICALS) ×2 IMPLANT
TUBE CONNECTING 20X1/4 (TUBING) ×2 IMPLANT

## 2019-12-07 NOTE — Op Note (Signed)

## 2019-12-07 NOTE — Anesthesia Procedure Notes (Signed)
Procedure Name: Intubation Date/Time: 12/07/2019 9:35 AM Performed by: Myna Bright, CRNA Pre-anesthesia Checklist: Patient identified, Emergency Drugs available, Suction available and Patient being monitored Patient Re-evaluated:Patient Re-evaluated prior to induction Oxygen Delivery Method: Circle system utilized Preoxygenation: Pre-oxygenation with 100% oxygen Induction Type: IV induction Ventilation: Mask ventilation without difficulty Laryngoscope Size: Mac and 4 Grade View: Grade II Tube type: Oral Tube size: 8.0 mm Number of attempts: 1 Airway Equipment and Method: Stylet Placement Confirmation: ETT inserted through vocal cords under direct vision,  positive ETCO2 and breath sounds checked- equal and bilateral Secured at: 22 cm Tube secured with: Tape Dental Injury: Teeth and Oropharynx as per pre-operative assessment

## 2019-12-07 NOTE — Discharge Instructions (Signed)
No Tylenol until 3 pm, No ibuprofen until 5pm   Post Anesthesia Home Care Instructions  Activity: Get plenty of rest for the remainder of the day. A responsible individual must stay with you for 24 hours following the procedure.  For the next 24 hours, DO NOT: -Drive a car -Advertising copywriter -Drink alcoholic beverages -Take any medication unless instructed by your physician -Make any legal decisions or sign important papers.  Meals: Start with liquid foods such as gelatin or soup. Progress to regular foods as tolerated. Avoid greasy, spicy, heavy foods. If nausea and/or vomiting occur, drink only clear liquids until the nausea and/or vomiting subsides. Call your physician if vomiting continues.  Special Instructions/Symptoms: Your throat may feel dry or sore from the anesthesia or the breathing tube placed in your throat during surgery. If this causes discomfort, gargle with warm salt water. The discomfort should disappear within 24 hours.  If you had a scopolamine patch placed behind your ear for the management of post- operative nausea and/or vomiting:  1. The medication in the patch is effective for 72 hours, after which it should be removed.  Wrap patch in a tissue and discard in the trash. Wash hands thoroughly with soap and water. 2. You may remove the patch earlier than 72 hours if you experience unpleasant side effects which may include dry mouth, dizziness or visual disturbances. 3. Avoid touching the patch. Wash your hands with soap and water after contact with the patch.    [facility]   Hospital Summary Xaviar Gerstel Date of birth: 1956-04-05  12/07/2019 [facility] [phone]             Medication Overview Your medications have changed START taking:  oxyCODONE (Oxy IR/ROXICODONE)  CHANGE how you take:  ibuprofen (ADVIL)  Review your updated medication list below.                        Your Primary Care Provider Rometta Emery, MD    You are  allergic to the following You are allergic to the following  No active allergies                 DISCHARGE INSTRUCTIONS Diet - low sodium heart healthy  Increase activity slowly          To Do List To Do List   Schedule an appointment with Abigail Miyamoto, MD as soon as possible for a visit in 3 week(s)  Specialty: General Surgery [facility]  [address]  [place] [address]  [phone]              Immunizations Administered for This Admission No immunizations on file.           Current Medication List-Note: Take only these medications. Stop taking all medications not included on this list. If you have any questions regarding medications not on this list, please follow up with your healthcare provider. Bring this list to your next appointment. Current Medication List-Note: Take only these medications. Stop taking all medications not included on this list. If you have any questions regarding medications not on this list, please follow up with your healthcare provider. Bring this list to your next appointment.      Medication Details Next Dose Due Morning Afternoon Evening Bedtime As Needed   betamethasone dipropionate 0.05 % cream  Apply 1 application topically 2 (two) times daily. On hand  hydrochlorothiazide 25 MG tablet  Commonly known as: HYDRODIURIL  Take 25 mg by mouth daily.          ibuprofen 600 MG tablet  Commonly known as: ADVIL  Take 1 tablet (600 mg total) by mouth every 6 (six) hours as needed.  What changed: Another medication with the same name was removed. Continue taking this medication, and follow the directions you see here.          oxyCODONE 5 MG immediate release tablet  Commonly known as: Oxy IR/ROXICODONE  Take 1 tablet (5 mg total) by mouth every 6 (six) hours as needed for moderate pain or severe pain.  For: Moderate to Severe Pain            Where to pick up your medications Pick up  these medications at [facility] [place] - [address] oxyCODONE Address: [address] [facility] ([place]) [place] [address]  Phone: [phone]                    Opioid Prescription Information ?  IMPORTANT INFORMATION ABOUT YOUR OPIOID PRESCRIPTION  What are Opioids? How Can I Safely Take Opioids? Where Can I Get Help?  Opioids are narcotic pain medications used for moderate-to-severe pain, often after surgery or injury.  Evidence shows there may be better and safer options for long term pain from back pain, fibromyalgia, and nerve pain other than opioids. Do not take more tablets or capsules than prescribed by your doctor or more often than prescribed.  Do not crush long-acting tablets.  Do not share or sell your prescription.  Store medication in a safe, secure, dry location away from pets, children, family, and visitors.  Naloxone, a medication that can reverse opioid-induced oversedation, can be purchased at some pharmacies and kept on hand for emergency use. IF YOU NEED HELP for substance abuse problems, talk with your primary care provider and contact a local resource:  [place] (Community United in Response to an Epidemic) SaveSearches.co.nz  [facility] Recovery Service 640-403-7228  Caren Macadam [facility] Solution to the Opioid Problem)  [place] [phone]  Narcotics Anonymous www.[place] 1-800-662-HELP or visit https://findtreatment.[place].com.cy to find a treatment center near you  [place] [phone]  Updated 05/25/2018  What are the Major  Risks of Opioid Use?    Opioid use can have serious risk of addiction and  overdose. Overdose is often seen as slow breathing and can cause sudden death.  Other risks include tolerance (needing more for the same relief) and dependence (withdrawal when stopped).     How Do I Get Rid of Unused Opioids?    Safely dispose of all opioids as soon as you no longer need them.  To dispose at home, mix the drug with an undesirable substance like coffee grounds or cat litter then throw them away.  Opioids can be dropped off at local sheriff's offices or police departments.  If unable to safely dispose of them as above, opioids may be flushed.   What are Some Ways to Help Decrease Major Risk for Overdose?    Avoid alcohol, other opioids, sedating antihistamines (Benadryl, etc.) drugs for sleep/anxiety (Xanax, Ativan, Valium, Ambien, etc.), and muscle relaxants (Flexeril, Soma, Skelaxin, etc.).  MyChart Ready to Activate Your MyChart Account?  Your Activation Code is:  [email or web address]  Expires: 01/20/2020 11:37 AM Go to  [email or web address]/,  or download the MyChart app. Go to the app store, search "MyChart", open the app, select [place], and activate your account.  Need technical help? Call 336-83-CHART.  For Medical questions please contact your provider.    Instructions No Tylenol until 3 pm, No ibuprofen until 5pm   Post Anesthesia Home Care Instructions  Activity:  Get plenty of rest for the remainder of the day. A responsible individual must stay with you for 24 hours following the procedure.  For the next 24 hours, DO NOT:  -Drive a car  -Advertising copywriter  -Drink alcoholic beverages  -Take any medication unless instructed by your physician  -Make any legal decisions or sign important papers.  Meals:  Start with liquid foods such as gelatin or soup. Progress to regular foods as tolerated. Avoid greasy, spicy, heavy foods. If nausea and/or vomiting occur, drink only clear liquids until the nausea and/or vomiting subsides.  Call your physician if vomiting continues.  Special Instructions/Symptoms:  Your throat may feel dry or sore from the anesthesia or the breathing tube placed in your throat during surgery. If this causes discomfort, gargle with warm salt water. The discomfort should disappear within 24 hours.  If you had a scopolamine patch placed behind your ear for the management of post- operative nausea and/or vomiting:  1. The medication in the patch is effective for 72 hours, after which it should be removed. Wrap patch in a tissue and discard in the trash. Wash hands thoroughly with soap and water.  2. You may remove the patch earlier than 72 hours if you experience unpleasant side effects which may include dry mouth, dizziness or visual disturbances.  3. Avoid touching the patch. Wash your hands with soap and water after contact with the patch.          Suicide Resources  Who to Call  Call 911 National Suicide Prevention Hotline 1-800-SUICIDE or (800) 984-299-9863) [facility] at 217 342 8929; 573-778-3814 More Resources  Suicide Awareness Voices of Education (816)348-2311  www.save.org  [employer] on Mental Illness(NAMI) (800) 950-NAMI  www.nami.org  American Association of Suicidology 817-707-0561  www.suicidology.org       Patient Belongings   Most Recent Value  Patient Belongings  Belongings Sent to Safe None  Belongings In [place] (Preop/Short Stay/[facility] only) Civil Service fast streamer  Patient Belongings Sent Home  Patient Belongings Sent to Safe  Belongings Sent to Safe None  Patient Belongings/Medications Returned     ?        Care Everywhere ID: [id number]      Discharge instructions (including medications) discussed with and copy provided to patient/caregiver  CCS ______[facility] LAPAROSCOPIC SURGERY: POST OP INSTRUCTIONS Always review your discharge instruction sheet given to you by the facility where your surgery was  performed. IF YOU HAVE DISABILITY OR FAMILY LEAVE FORMS, YOU MUST BRING THEM TO THE OFFICE FOR PROCESSING.   DO NOT GIVE THEM TO YOUR DOCTOR.  1. A prescription for pain medication may be given to you upon discharge.  Take your pain medication as prescribed, if needed.  If narcotic pain medicine is not needed, then you may take acetaminophen (Tylenol) or ibuprofen (Advil) as needed. 2. Take your usually prescribed medications unless otherwise directed. 3. If you need a refill on your pain medication, please contact your pharmacy.  They will  contact our office to request authorization. Prescriptions will not be filled after 5pm or on week-ends. 4. You should follow a light diet the first few days after arrival home, such as soup and crackers, etc.  Be sure to include lots of fluids daily. 5. Most patients will experience some swelling and bruising in the area of the incisions.  Ice packs will help.  Swelling and bruising can take several days to resolve.  6. It is common to experience some constipation if taking pain medication after surgery.  Increasing fluid intake and taking a stool softener (such as Colace) will usually help or prevent this problem from occurring.  A mild laxative (Milk of Magnesia or Miralax) should be taken according to package instructions if there are no bowel movements after 48 hours. 7. Unless discharge instructions indicate otherwise, you may remove your bandages 24-48 hours after surgery, and you may shower at that time.  You may have steri-strips (small skin tapes) in place directly over the incision.  These strips should be left on the skin for 7-10 days.  If your surgeon used skin glue on the incision, you may shower in 24 hours.  The glue will flake off over the next 2-3 weeks.  Any sutures or staples will be removed at the office during your follow-up visit. 8. ACTIVITIES:  You may resume regular (light) daily activities beginning the next day--such as daily self-care,  walking, climbing stairs--gradually increasing activities as tolerated.  You may have sexual intercourse when it is comfortable.  Refrain from any heavy lifting or straining until approved by your doctor. a. You may drive when you are no longer taking prescription pain medication, you can comfortably wear a seatbelt, and you can safely maneuver your car and apply brakes. b. RETURN TO WORK:  __________________________________________________________ 9. You should see your doctor in the office for a follow-up appointment approximately 2-3 weeks after your surgery.  Make sure that you call for this appointment within a day or two after you arrive home to insure a convenient appointment time. 10. OTHER INSTRUCTIONS:OK TO SHOWER STARTING TOMORROW 11. ICE PACK, TYLENOL, AND IBUPROFEN ALSO FOR PAIN  NO TYLENOL UNTIL 2:24 PM NO ADVIL UNTIL   4:24 PM   12. NO LIFTING MORE THAN 15 POUNDS FOR 2 WEEKS __________________________________________________________________________________________________________________________ __________________________________________________________________________________________________________________________ WHEN TO CALL YOUR DOCTOR: 1. Fever over 101.0 2. Inability to urinate 3. Continued bleeding from incision. 4. Increased pain, redness, or drainage from the incision. 5. Increasing abdominal pain  The clinic staff is available to answer your questions during regular business hours.  Please don't hesitate to call and ask to speak to one of the nurses for clinical concerns.  If you have a medical emergency, go to the nearest emergency room or call 911.  A surgeon from [facility] is always on call at the hospital. [address], [place], [place]  [address] ? [address] [address], [place], [place]   [address] [phone] ? [phone] ? FAX [phone]    Web site: [email or web address]\

## 2019-12-07 NOTE — Interval H&P Note (Signed)
History and Physical Interval Note:no change in H and P  12/07/2019 9:09 AM  Jimmy Hamilton  has presented today for surgery, with the diagnosis of BILIARY DYSKINESIA.  The various methods of treatment have been discussed with the patient and family. After consideration of risks, benefits and other options for treatment, the patient has consented to  Procedure(s): LAPAROSCOPIC CHOLECYSTECTOMY (N/A) as a surgical intervention.  The patient's history has been reviewed, patient examined, no change in status, stable for surgery.  I have reviewed the patient's chart and labs.  Questions were answered to the patient's satisfaction.     Abigail Miyamoto

## 2019-12-07 NOTE — Anesthesia Postprocedure Evaluation (Signed)
Anesthesia Post Note  Patient: Jimmy Hamilton  Procedure(s) Performed: LAPAROSCOPIC CHOLECYSTECTOMY (N/A Abdomen)     Patient location during evaluation: PACU Anesthesia Type: General Level of consciousness: awake and alert Pain management: pain level controlled Vital Signs Assessment: post-procedure vital signs reviewed and stable Respiratory status: spontaneous breathing, nonlabored ventilation, respiratory function stable and patient connected to nasal cannula oxygen Cardiovascular status: blood pressure returned to baseline and stable Postop Assessment: no apparent nausea or vomiting Anesthetic complications: no    Last Vitals:  Vitals:   12/07/19 1130 12/07/19 1145  BP: 140/71 (!) 146/89  Pulse: 76 74  Resp: 14 13  Temp:    SpO2: 93% 100%    Last Pain:  Vitals:   12/07/19 1130  TempSrc:   PainSc: 6                  Naela Nodal DAVID

## 2019-12-07 NOTE — Interval H&P Note (Signed)
History and Physical Interval Note: no change in H and P  12/07/2019 7:59 AM  Jimmy Hamilton  has presented today for surgery, with the diagnosis of BILIARY DYSKINESIA.  The various methods of treatment have been discussed with the patient and family. After consideration of risks, benefits and other options for treatment, the patient has consented to  Procedure(s): LAPAROSCOPIC CHOLECYSTECTOMY (N/A) as a surgical intervention.  The patient's history has been reviewed, patient examined, no change in status, stable for surgery.  I have reviewed the patient's chart and labs.  Questions were answered to the patient's satisfaction.     Abigail Miyamoto

## 2019-12-07 NOTE — Anesthesia Preprocedure Evaluation (Signed)
Anesthesia Evaluation  Patient identified by MRN, date of birth, ID band Patient awake    Reviewed: Allergy & Precautions, NPO status , Patient's Chart, lab work & pertinent test results  Airway Mallampati: I  TM Distance: >3 FB Neck ROM: Full    Dental   Pulmonary    Pulmonary exam normal        Cardiovascular hypertension, Pt. on medications Normal cardiovascular exam     Neuro/Psych    GI/Hepatic GERD  Medicated and Controlled,  Endo/Other    Renal/GU      Musculoskeletal   Abdominal   Peds  Hematology   Anesthesia Other Findings   Reproductive/Obstetrics                             Anesthesia Physical Anesthesia Plan  ASA: II  Anesthesia Plan: General   Post-op Pain Management:    Induction: Intravenous  PONV Risk Score and Plan: 2 and Midazolam and Ondansetron  Airway Management Planned: Oral ETT  Additional Equipment:   Intra-op Plan:   Post-operative Plan: Extubation in OR  Informed Consent: I have reviewed the patients History and Physical, chart, labs and discussed the procedure including the risks, benefits and alternatives for the proposed anesthesia with the patient or authorized representative who has indicated his/her understanding and acceptance.       Plan Discussed with: CRNA and Surgeon  Anesthesia Plan Comments:         Anesthesia Quick Evaluation

## 2019-12-07 NOTE — Transfer of Care (Signed)
Immediate Anesthesia Transfer of Care Note  Patient: Jimmy Hamilton  Procedure(s) Performed: LAPAROSCOPIC CHOLECYSTECTOMY (N/A Abdomen)  Patient Location: PACU  Anesthesia Type:General  Level of Consciousness: awake, alert , oriented and patient cooperative  Airway & Oxygen Therapy: Patient Spontanous Breathing and Patient connected to face mask oxygen  Post-op Assessment: Report given to RN, Post -op Vital signs reviewed and stable and Patient moving all extremities  Post vital signs: Reviewed and stable  Last Vitals:  Vitals Value Taken Time  BP    Temp    Pulse 73 12/07/19 1018  Resp 19 12/07/19 1018  SpO2 100 % 12/07/19 1018  Vitals shown include unvalidated device data.  Last Pain:  Vitals:   12/07/19 0908  TempSrc: Tympanic  PainSc: 0-No pain         Complications: No apparent anesthesia complications

## 2019-12-08 LAB — SURGICAL PATHOLOGY

## 2019-12-13 ENCOUNTER — Encounter: Payer: Self-pay | Admitting: *Deleted

## 2020-03-01 ENCOUNTER — Encounter (HOSPITAL_COMMUNITY): Payer: Self-pay | Admitting: *Deleted

## 2020-03-01 ENCOUNTER — Other Ambulatory Visit: Payer: Self-pay

## 2020-03-01 ENCOUNTER — Emergency Department (HOSPITAL_COMMUNITY)
Admission: EM | Admit: 2020-03-01 | Discharge: 2020-03-01 | Disposition: A | Discharge status: Home / Self Care | Payer: Self-pay | Attending: Emergency Medicine | Admitting: Emergency Medicine

## 2020-03-01 DIAGNOSIS — I10 Essential (primary) hypertension: Secondary | ICD-10-CM | POA: Insufficient documentation

## 2020-03-01 DIAGNOSIS — Z86718 Personal history of other venous thrombosis and embolism: Secondary | ICD-10-CM | POA: Insufficient documentation

## 2020-03-01 DIAGNOSIS — M5441 Lumbago with sciatica, right side: Secondary | ICD-10-CM | POA: Insufficient documentation

## 2020-03-01 LAB — URINALYSIS, ROUTINE W REFLEX MICROSCOPIC
Bilirubin Urine: NEGATIVE
Glucose, UA: NEGATIVE mg/dL
Hgb urine dipstick: NEGATIVE
Ketones, ur: NEGATIVE mg/dL
Leukocytes,Ua: NEGATIVE
Nitrite: NEGATIVE
Protein, ur: NEGATIVE mg/dL
Specific Gravity, Urine: 1.013 (ref 1.005–1.030)
pH: 6 (ref 5.0–8.0)

## 2020-03-01 LAB — BASIC METABOLIC PANEL
Anion gap: 9 (ref 5–15)
BUN: 16 mg/dL (ref 8–23)
CO2: 26 mmol/L (ref 22–32)
Calcium: 9 mg/dL (ref 8.9–10.3)
Chloride: 104 mmol/L (ref 98–111)
Creatinine, Ser: 1.25 mg/dL — ABNORMAL HIGH (ref 0.61–1.24)
GFR calc Af Amer: >60 mL/min (ref >60)
GFR calc non Af Amer: >60 mL/min (ref >60)
Glucose, Bld: 123 mg/dL — ABNORMAL HIGH (ref 70–99)
Potassium: 3.8 mmol/L (ref 3.5–5.1)
Sodium: 139 mmol/L (ref 135–145)

## 2020-03-01 LAB — CBC
HCT: 41.1 % (ref 39.0–52.0)
Hemoglobin: 13.8 g/dL (ref 13.0–17.0)
MCH: 29.2 pg (ref 26.0–34.0)
MCHC: 33.6 g/dL (ref 30.0–36.0)
MCV: 87.1 fL (ref 80.0–100.0)
Platelets: 251 10*3/uL (ref 150–400)
RBC: 4.72 MIL/uL (ref 4.22–5.81)
RDW: 12.7 % (ref 11.5–15.5)
WBC: 6.1 10*3/uL (ref 4.0–10.5)
nRBC: 0 % (ref 0.0–0.2)

## 2020-03-01 MED ORDER — PREDNISONE 10 MG (21) PO TBPK
ORAL_TABLET | Freq: Every day | ORAL | 0 refills | Status: DC
Start: 2020-03-01 — End: 2020-08-29

## 2020-03-01 MED ORDER — KETOROLAC TROMETHAMINE 15 MG/ML IJ SOLN
15.0000 mg | Freq: Once | INTRAMUSCULAR | Status: AC
  Administered 2020-03-01: 15 mg via INTRAMUSCULAR
  Filled 2020-03-01: qty 1

## 2020-03-01 MED ORDER — METHOCARBAMOL 500 MG PO TABS
500.0000 mg | ORAL_TABLET | Freq: Every evening | ORAL | 0 refills | Status: DC | PRN
Start: 2020-03-01 — End: 2023-10-14

## 2020-03-01 NOTE — ED Notes (Signed)
Pt discharge instructions reviewed with the patient. The patient verbalized understanding of instructions. Pt discharged. 

## 2020-03-01 NOTE — Discharge Instructions (Signed)
Take prednisone as prescribed.  Do not take other anti-inflammatories at the same time (Advil, Motrin, ibuprofen, naproxen, Aleve). You may supplement with Tylenol if you need further pain control. Use Robaxin as needed for muscle stiffness or soreness. Have caution, as this may make you tired or groggy. Do not drive or operate heavy machinery while taking this medication.  Use muscle creams (bengay, icy hot, salonpas) as needed for pain.  Due to the back stretches that are listed in the paperwork to help relax the muscles. Follow up with your primary care doctor if pain is not improving with this treatment.  Return to the ER if you develop high fevers, numbness, loss of bowel or bladder control, or any new or concerning symptoms.

## 2020-03-01 NOTE — ED Triage Notes (Addendum)
C/o lower back pain onset 2 days ago states it got worse last yest. No history of injury. States he has a hernia repair 2/3.states he has a history of kidneys however this pain is different.

## 2020-03-01 NOTE — ED Provider Notes (Signed)
[facility] EMERGENCY DEPARTMENT Provider Note   CSN: [id number] Arrival date & time: 03/01/20  0357     History Chief Complaint  Patient presents with  . Back Pain    Jimmy Hamilton is a 64 y.o. male presenting for evaluation of back pain.  Patient states that the past 2 days, he has been having severe back pain.  Initially was all over, but now is only on his right side.  When the pain is severe, he is unable to walk due to pain in his right leg.  He denies fall, trauma, or injury.  He denies numbness or tingling.  He denies fevers, chills, nausea, vomiting, domino pain, urinary symptoms, loss of bowel bladder control.  He denies previous history of back problems.  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  He took Aleve x1, without improvement of symptoms.  He has not taken anything else.  He denies history of cancer or IVDU. He reports a h/o HTN for which he takes medication (has not taken today), no other medical problems.   HPI     Past Medical History:  Diagnosis Date  . Chest pain 04/2019   ed to hospital admission; stress and echo both normal ; patient reports his PCP at the time told him he did not need to see cardiology   . DVT (deep venous thrombosis) (HCC) 2019   left leg  . GERD (gastroesophageal reflux disease)   . Hypertension   . Kidney stone    passed independently     Patient Active Problem List   Diagnosis Date Noted  . Chest pain 04/05/2019  . Essential hypertension 04/04/2019    Past Surgical History:  Procedure Laterality Date  . CHOLECYSTECTOMY N/A 12/07/2019   Procedure: LAPAROSCOPIC CHOLECYSTECTOMY;  Surgeon: Abigail Miyamoto, MD;  Location: [facility];  Service: General;  Laterality: N/A;  . COLONOSCOPY         Family History  Problem Relation Age of Onset  . Diabetes Brother   . CAD Neg Hx   . Cancer Neg Hx      Social History   Tobacco Use  . Smoking status: Never Smoker  . Smokeless tobacco: Never Used  Substance Use Topics  . Alcohol use: No  . Drug use: No    Home Medications Prior to Admission medications   Medication Sig Start Date End Date Taking? Authorizing Provider  betamethasone dipropionate (DIPROLENE) 0.05 % cream Apply 1 application topically 2 (two) times daily. On hand 06/14/19   [provider]  hydrochlorothiazide (HYDRODIURIL) 25 MG tablet Take 25 mg by mouth daily.    [provider]  ibuprofen (ADVIL) 600 MG tablet Take 1 tablet (600 mg total) by mouth every 6 (six) hours as needed. 06/22/19   Arby Barrette, MD  methocarbamol (ROBAXIN) 500 MG tablet Take 1 tablet (500 mg total) by mouth at bedtime as needed for muscle spasms. 03/01/20   Maame Dack, PA-C  oxyCODONE (OXY IR/ROXICODONE) 5 MG immediate release tablet Take 1 tablet (5 mg total) by mouth every 6 (six) hours as needed for moderate pain or severe pain. 12/07/19   Abigail Miyamoto, MD  predniSONE (STERAPRED UNI-PAK 21 TAB) 10 MG (21) TBPK tablet Take by mouth daily. Take 6 tabs by mouth daily  for 2 days, then 5 tabs for 2 days, then 4 tabs for 2 days, then 3 tabs  for 2 days, 2 tabs for 2 days, then 1 tab by mouth daily for 2 days 03/01/20   Leita Lindbloom, PA-C    Allergies    Patient has no known allergies.  Review of Systems   Review of Systems  Musculoskeletal: Positive for back pain.  All other systems reviewed and are negative.   Physical Exam Updated Vital Signs BP (!) 156/81 (BP Location: Right Arm)   Pulse 64   Temp 97.8 F (36.6 C) (Oral)   Resp 20   Ht 5\' 9"  (1.753 m)   Wt 105.2 kg   SpO2 100%   BMI 34.26 kg/m   Physical Exam Vitals and nursing note reviewed.  Constitutional:      General: He is not in acute distress.    Appearance: He is well-developed.     Comments: Appears uncomfortable due to pain, otherwise nontoxic  HENT:     Head: Normocephalic and  atraumatic.  Eyes:     Conjunctiva/sclera: Conjunctivae normal.     Pupils: Pupils are equal, round, and reactive to light.  Cardiovascular:     Rate and Rhythm: Normal rate and regular rhythm.     Pulses: Normal pulses.  Pulmonary:     Effort: Pulmonary effort is normal. No respiratory distress.     Breath sounds: Normal breath sounds. No wheezing.  Abdominal:     General: There is no distension.     Palpations: Abdomen is soft. There is no mass.     Tenderness: There is no abdominal tenderness. There is no guarding or rebound.  Musculoskeletal:        General: Normal range of motion.     Cervical back: Normal range of motion and neck supple.       Back:     Comments: Focal tenderness palpation over the SI joint on the right side.  No tenderness palpation of the left back.  No tenderness palpation midline C-spine.  No step-offs or deformities.  No obvious trauma.  Plantar and dorsiflexion strength equal bilaterally.  Pedal pulses 2+ bilaterally.  No saddle paresthesias.  Increased pain with straight leg raise on the right, no pain on the left.  Skin:    General: Skin is warm and dry.     Capillary Refill: Capillary refill takes less than 2 seconds.  Neurological:     Mental Status: He is alert and oriented to person, place, and time.     ED Results / Procedures / Treatments   Labs (all labs ordered are listed, but only abnormal results are displayed) Labs Reviewed  BASIC METABOLIC PANEL - Abnormal; Notable for the following components:      Result Value   Glucose, Bld 123 (*)    Creatinine, Ser 1.25 (*)    All other components within normal limits  CBC  URINALYSIS, ROUTINE W REFLEX MICROSCOPIC    EKG None  Radiology No results found.  Procedures Procedures (including critical care time)  Medications Ordered in ED Medications  ketorolac (TORADOL) 15 MG/ML injection 15 mg (15 mg Intramuscular Given 03/01/20 1035)    ED Course  I have reviewed the triage vital  signs and the nursing notes.  Pertinent labs & imaging results that were available during my care of the patient were reviewed by me and considered in my medical decision making (see chart for details).    MDM Rules/Calculators/A&P  Patient presenting for evaluation of low back pain.  Physical exam reassuring, neurovascularly intact.  No red flags for back pain.  Pain is reproducible with palpation over the SI joint,a nd worse with SLR, likely sciatica.  Doubt fracture, I do not believe x-rays will be beneficial.  Doubt vertebral injury, infection, spinal cord compression, myelopathy, or cauda equina syndrome.  Labs obtained from triage interpreted by me, overall reassuring.  Creatinine at baseline.  Otherwise labs are negative.  Patient is mildly hypertensive, likely secondary to not take his medicine this morning as well as pain.  Will treat symptomatically with steroid taper, muscle relaxers, muscle creams.  Patient given information to follow-up with primary care if not improving.  At this time, patient appears safe for discharge.  Return precautions given.  Patient states he understands and agrees to plan.  Final Clinical Impression(s) / ED Diagnoses Final diagnoses:  Acute right-sided low back pain with right-sided sciatica    Rx / DC Orders ED Discharge Orders         Ordered    predniSONE (STERAPRED UNI-PAK 21 TAB) 10 MG (21) TBPK tablet  Daily     03/01/20 1029    methocarbamol (ROBAXIN) 500 MG tablet  At bedtime PRN     03/01/20 1029           Tyray Proch, PA-C 03/01/20 1330    Margarita Grizzle, MD 03/01/20 1551

## 2020-04-13 ENCOUNTER — Other Ambulatory Visit: Payer: Self-pay | Admitting: Sports Medicine

## 2020-04-13 DIAGNOSIS — M5136 Other intervertebral disc degeneration, lumbar region: Secondary | ICD-10-CM

## 2020-04-23 ENCOUNTER — Other Ambulatory Visit: Payer: Self-pay

## 2020-04-23 ENCOUNTER — Ambulatory Visit
Admission: RE | Admit: 2020-04-23 | Discharge: 2020-04-23 | Disposition: A | Discharge status: Home / Self Care | Payer: 59 | Source: Ambulatory Visit | Attending: Sports Medicine | Admitting: Sports Medicine

## 2020-04-23 DIAGNOSIS — M5136 Other intervertebral disc degeneration, lumbar region: Secondary | ICD-10-CM

## 2020-04-23 MED ORDER — IOPAMIDOL (ISOVUE-M 200) INJECTION 41%
1.0000 mL | Freq: Once | INTRAMUSCULAR | Status: AC
  Administered 2020-04-23: 1 mL via EPIDURAL

## 2020-04-23 MED ORDER — METHYLPREDNISOLONE ACETATE 40 MG/ML INJ SUSP (RADIOLOG
120.0000 mg | Freq: Once | INTRAMUSCULAR | Status: AC
  Administered 2020-04-23: 120 mg via EPIDURAL

## 2020-04-23 NOTE — Discharge Instructions (Signed)

## 2020-08-29 ENCOUNTER — Encounter: Payer: Self-pay | Admitting: Cardiology

## 2020-08-29 ENCOUNTER — Other Ambulatory Visit: Payer: Self-pay

## 2020-08-29 ENCOUNTER — Ambulatory Visit: Payer: 59 | Admitting: Cardiology

## 2020-08-29 VITALS — BP 132/79 | HR 87 | Ht 69.0 in | Wt 234.0 lb

## 2020-08-29 DIAGNOSIS — R0683 Snoring: Secondary | ICD-10-CM

## 2020-08-29 DIAGNOSIS — E6609 Other obesity due to excess calories: Secondary | ICD-10-CM

## 2020-08-29 DIAGNOSIS — I10 Essential (primary) hypertension: Secondary | ICD-10-CM

## 2020-08-29 DIAGNOSIS — Z6834 Body mass index (BMI) 34.0-34.9, adult: Secondary | ICD-10-CM

## 2020-08-29 DIAGNOSIS — I440 Atrioventricular block, first degree: Secondary | ICD-10-CM

## 2020-08-29 NOTE — Progress Notes (Signed)
Date:  08/29/2020   ID:  Jimmy Hamilton, DOB 08/16/1956, MRN [id number]  PCP:  Rometta EmeryGarba, Mohammad L, MD  Cardiologist:  Tessa LernerSunit Tranika Scholler, DO, [facility] (established care 08/29/2020) Former Cardiology Providers: Dr. Zoila ShutterKenneth Hilty and Marjie Skiffallie Goodrich PA-C  REASON FOR CONSULT: Hypertension and Snoring.   REQUESTING PHYSICIAN:  Rometta EmeryGarba, Mohammad L, MD [address] [place] [id number]  Chief Complaint  Patient presents with  . New Patient (Initial Visit)  . Snoring    HPI  Jimmy Hamilton is a 64 y.o. male who presents to the office with a chief complaint of " snoring." Patient's past medical history and cardiovascular risk factors include: Hypertension, GERD, obesity due to excess calorie, hx of DVT.    He is referred to the office at the request of Rometta EmeryGarba, Mohammad L, MD for evaluation of hypertension and snoring.  Patient is referred to the office for evaluation of hypertension and snoring.  Patient states that his blood pressure is very well controlled.  His blood pressure at today's office visit is within acceptable range.  He did not have a blood pressure log for me to review and is not able to comment on his average blood pressures at home either.   Patient states that he is more concerned about his difficulty in breathing while he is asleep.  He states that he snores excessively and frequently falls asleep.  He gets about 4-6 hours of sleep per day.  No sleeping aids.  He is a mouth breather as well.  When he wakes up he does not complain of any headaches or dry mouth.  Patient had brought in his audio clips and pictures to convey the fact that he is a mouth breather and excessive snoring at night.  He denies any chest pain or shortness of breath at rest or with effort related activities.  He recently had an echocardiogram and stress test performed by his former cardiologist.  Results were reviewed and noted below for further reference.  FUNCTIONAL STATUS: No structured exercise  program or daily routine.   ALLERGIES: No Known Allergies  MEDICATION LIST PRIOR TO VISIT: Current Meds  Medication Sig  . betamethasone dipropionate (DIPROLENE) 0.05 % cream Apply 1 application topically 2 (two) times daily. On hand  . diclofenac Sodium (VOLTAREN) 1 % GEL as needed.  . hydrochlorothiazide (HYDRODIURIL) 25 MG tablet Take 25 mg by mouth daily.  . methocarbamol (ROBAXIN) 500 MG tablet Take 1 tablet (500 mg total) by mouth at bedtime as needed for muscle spasms.  Marland Kitchen. omeprazole (PRILOSEC) 40 MG capsule Take 40 mg by mouth 2 (two) times daily.  Marland Kitchen. oxyCODONE (OXY IR/ROXICODONE) 5 MG immediate release tablet Take 1 tablet (5 mg total) by mouth every 6 (six) hours as needed for moderate pain or severe pain.  Marland Kitchen. testosterone cypionate (DEPOTESTOSTERONE CYPIONATE) 200 MG/ML injection Inject 1 mg into the skin every 14 (fourteen) days.     PAST MEDICAL HISTORY: Past Medical History:  Diagnosis Date  . Chest pain 04/2019   ed to hospital admission; stress and echo both normal ; patient reports his PCP at the time told him he did not need to see cardiology   . DVT (deep venous thrombosis) (HCC) 2019   left leg  . GERD (gastroesophageal reflux disease)   . Hypertension   . Kidney stone    passed independently     PAST SURGICAL HISTORY: Past Surgical History:  Procedure Laterality Date  . CHOLECYSTECTOMY N/A 12/07/2019  Procedure: LAPAROSCOPIC CHOLECYSTECTOMY;  Surgeon: Abigail Miyamoto, MD;  Location: [facility];  Service: General;  Laterality: N/A;  . COLONOSCOPY      FAMILY HISTORY: The patient family history includes Diabetes in his brother.  SOCIAL HISTORY:  The patient  reports that he has never smoked. He has never used smokeless tobacco. He reports that he does not drink alcohol and does not use drugs.  REVIEW OF SYSTEMS: Review of Systems  Constitutional: Negative for chills and fever.  HENT: Negative for hoarse voice and nosebleeds.   Eyes:  Negative for discharge, double vision and pain.  Cardiovascular: Negative for chest pain, claudication, dyspnea on exertion, leg swelling, near-syncope, orthopnea, palpitations, paroxysmal nocturnal dyspnea and syncope.  Respiratory: Positive for sleep disturbances due to breathing and snoring. Negative for hemoptysis and shortness of breath.   Musculoskeletal: Negative for muscle cramps and myalgias.  Gastrointestinal: Negative for abdominal pain, constipation, diarrhea, hematemesis, hematochezia, melena, nausea and vomiting.  Neurological: Negative for dizziness and light-headedness.    PHYSICAL EXAM: Vitals with BMI 08/29/2020 04/23/2020 03/01/2020  Height 5\' 9"  - -  Weight 234 lbs - -  BMI 34.54 - -  Systolic 132 150  Diastolic 79 80 81  Pulse 87 91 64   CONSTITUTIONAL: Well-developed and well-nourished. No acute distress.  SKIN: Skin is warm and dry. No rash noted. No cyanosis. No pallor. No jaundice HEAD: Normocephalic and atraumatic.  EYES: No scleral icterus MOUTH/THROAT: Moist oral membranes.  NECK: No JVD present. No thyromegaly noted. No carotid bruits  LYMPHATIC: No visible cervical adenopathy.  CHEST Normal respiratory effort. No intercostal retractions  LUNGS: Clear to auscultation bilaterally.  No stridor. No wheezes. No rales.  CARDIOVASCULAR: Regular rate and rhythm, positive S1-S2, no murmurs rubs or gallops appreciated. ABDOMINAL: Abdominal obesity, soft, nontender, nondistended, positive bowel sounds all 4 quadrants, no apparent ascites.  EXTREMITIES: No peripheral edema  HEMATOLOGIC: No significant bruising NEUROLOGIC: Oriented to person, place, and time. Nonfocal. Normal muscle tone.  PSYCHIATRIC: Normal mood and affect. Normal behavior. Cooperative  CARDIAC DATABASE: EKG: 08/29/2020: Normal sinus rhythm, 88 bpm, LVH, ST-T changes most likely secondary to LVH, first-degree AV block, without underlying ischemia or injury  pattern.  Echocardiogram: 04/05/2019: 1. The left ventricle has normal systolic function with an ejection fraction of 60-65%. The cavity size was normal. Left ventricular diastolic parameters were normal. No evidence of left ventricular regional wall  motion abnormalities.  2. The right ventricle has normal systolic function. The cavity was normal. There is no increase in right ventricular wall thickness. Right ventricular systolic pressure could not be assessed.  3. Left atrial size was mildly dilated.  4. The aortic root is normal in size and structure.    Stress Testing: Myoview 04/15/2019:  Nuclear stress EF: 49%.  There was no ST segment deviation noted during stress.  T wave inversion present at rest, unchanged with stress.  This is a low risk study.  The left ventricular ejection fraction is mildly decreased (45-54%). Visually appears normal, 60%.  LABORATORY DATA: CBC Latest Ref Rng & Units 03/01/2020 06/22/2019 04/04/2019  WBC 4.0 - 10.5 K/uL 6.1 5.9 6.0  Hemoglobin 13.0 - 17.0 g/dL 06/04/2019 24.4 01.0  Hematocrit 39 - 52 % 41.1 42.3 44.4  Platelets 150 - 400 K/uL 251 231 229    CMP Latest Ref Rng & Units 03/01/2020 12/02/2019 06/22/2019  Glucose 70 - 99 mg/dL 06/24/2019) 536(U) 440(H)  BUN 8 - 23 mg/dL 16 14 12   Creatinine 0.61 -  1.24 mg/dL 1.61(W) 9.60(A) 5.40(J)  Sodium 135 - 145 mmol/L 139 140 138  Potassium 3.5 - 5.1 mmol/L 3.8 3.7 3.2(L)  Chloride 98 - 111 mmol/L 104 103 103  CO2 22 - 32 mmol/L 26 27 24   Calcium 8.9 - 10.3 mg/dL 9.0 9.7 9.1  Total Protein 6.5 - 8.1 g/dL - - 7.2  Total Bilirubin 0.3 - 1.2 mg/dL - - 0.5  Alkaline Phos 38 - 126 U/L - - 72  AST 15 - 41 U/L - - 26  ALT 0 - 44 U/L - - 31    Lipid Panel     Component Value Date/Time   CHOL 156 04/05/2019 0542   TRIG 54 04/05/2019 0542   HDL 50 04/05/2019 0542   CHOLHDL 3.1 04/05/2019 0542   VLDL 11 04/05/2019 0542   LDLCALC 95 04/05/2019 0542    No components found for: NTPROBNP No results for  input(s): PROBNP in the last 8760 hours. No results for input(s): TSH in the last 8760 hours.  BMP Recent Labs    12/02/19 1019 03/01/20 0505  NA 140 139  K 3.7 3.8  CL 103 104  CO2 27 26  GLUCOSE 200* 123*  BUN 14 16  CREATININE 1.27* 1.25*  CALCIUM 9.7 9.0  GFRNONAA >60 >60  GFRAA >60 >60    HEMOGLOBIN A1C Lab Results  Component Value Date   HGBA1C 6.2 (H) 04/05/2019   MPG 131.24 04/05/2019    IMPRESSION:    ICD-10-CM   1. Benign hypertension  I10 EKG 12-Lead  2. Snoring  R06.83   3. First degree AV block  I44.0   4. Class 1 obesity due to excess calories without serious comorbidity with body mass index (BMI) of 34.0 to 34.9 in adult  E66.09    Z68.34      RECOMMENDATIONS: Raquon Milledge is a 64 y.o. male whose past medical history and cardiac risk factors include: Hypertension, GERD, obesity due to excess calorie, hx of DVT.    Benign essential hypertension:  Office blood pressure is within acceptable range.  Patient is asked to invest in a blood pressure cuff and to keep a log of his blood pressures and to bring it in at the next office visit for review.  Low-salt diet recommended.  He is asked to call the office if his systolic blood pressures at home are consistently greater than 140 mmHg.  Medications reconciled.  Snoring:  Clinically I suspect he has a degree of obstructive sleep apnea and needs to be evaluated by sleep medicine.  I recommend that he speak to his primary care provider for sleep study evaluation.  Due to his language barrier he called his PCP's during today's encounter for me to convey the recommendations.  I spoke to his PCP's office staff who confirmed that he is already been referred to [facility] medicine at [facility] but the appointment may be delayed due to prolonged waiting.  Recommended they could consider [facility] for more expedited evaluation.  Will defer additional management to his PCP.  Obesity, due to  excess calories: . Body mass index is 34.56 kg/m. . I reviewed with the patient the importance of diet, regular physical activity/exercise, weight loss.   . Patient is educated on increasing physical activity gradually as tolerated.  With the goal of moderate intensity exercise for 30 minutes a day 5 days a week.  In June 2020 patient has been evaluated by our [facility].  At that  time he had undergone an echocardiogram and stress test results which were reviewed and noted above.  EKG today shows normal sinus rhythm without underlying injury pattern.  Educated on the importance of lifestyle modifications and improving his cardiovascular risk factors.  Discussed increasing physical activity as tolerated to help facilitate weight loss.  His recent lipid profile was checked in 2020 and his LDL was 95.  Patient states that he will have his lipids checked again with his PCP at his yearly physical.  And we discussed blood pressure management as noted above.  FINAL MEDICATION LIST END OF ENCOUNTER: No orders of the defined types were placed in this encounter.    Current Outpatient Medications:  .  betamethasone dipropionate (DIPROLENE) 0.05 % cream, Apply 1 application topically 2 (two) times daily. On hand, Disp: , Rfl:  .  diclofenac Sodium (VOLTAREN) 1 % GEL, as needed., Disp: , Rfl:  .  hydrochlorothiazide (HYDRODIURIL) 25 MG tablet, Take 25 mg by mouth daily., Disp: , Rfl:  .  methocarbamol (ROBAXIN) 500 MG tablet, Take 1 tablet (500 mg total) by mouth at bedtime as needed for muscle spasms., Disp: 14 tablet, Rfl: 0 .  omeprazole (PRILOSEC) 40 MG capsule, Take 40 mg by mouth 2 (two) times daily., Disp: , Rfl:  .  oxyCODONE (OXY IR/ROXICODONE) 5 MG immediate release tablet, Take 1 tablet (5 mg total) by mouth every 6 (six) hours as needed for moderate pain or severe pain., Disp: 25 tablet, Rfl: 0 .  testosterone cypionate (DEPOTESTOSTERONE CYPIONATE) 200 MG/ML injection, Inject 1  mg into the skin every 14 (fourteen) days., Disp: , Rfl:   Orders Placed This Encounter  Procedures  . EKG 12-Lead    There are no Patient Instructions on file for this visit.   --Continue cardiac medications as reconciled in final medication list. --Return in about 3 months (around 11/29/2020) for Reevaluation of, BP. Or sooner if needed. --Continue follow-up with your primary care physician regarding the management of your other chronic comorbid conditions.  Patient's questions and concerns were addressed to his satisfaction. He voices understanding of the instructions provided during this encounter.   This note was created using a voice recognition software as a result there may be grammatical errors inadvertently enclosed that do not reflect the nature of this encounter. Every attempt is made to correct such errors.  Total time spent: 45 minutes greater than 50% spent face-to-face, discussing disease management, recommendations conveyed over the phone to the patient's PCPs office, independently reviewed prior echocardiogram and stress test reports findings summarized above, and coordination of care.  Tessa Lerner, [place], [facility]  Pager: [phone] Office: [phone]

## 2020-11-29 ENCOUNTER — Ambulatory Visit: Payer: 59 | Admitting: Cardiology

## 2020-12-11 ENCOUNTER — Ambulatory Visit: Payer: 59 | Admitting: Cardiology

## 2020-12-11 ENCOUNTER — Other Ambulatory Visit: Payer: Self-pay

## 2020-12-11 ENCOUNTER — Encounter: Payer: Self-pay | Admitting: Cardiology

## 2020-12-11 VITALS — BP 149/86 | HR 110 | Temp 98.0°F | Resp 16 | Ht 69.0 in | Wt 230.0 lb

## 2020-12-11 DIAGNOSIS — Z6834 Body mass index (BMI) 34.0-34.9, adult: Secondary | ICD-10-CM

## 2020-12-11 DIAGNOSIS — E6609 Other obesity due to excess calories: Secondary | ICD-10-CM

## 2020-12-11 DIAGNOSIS — R072 Precordial pain: Secondary | ICD-10-CM

## 2020-12-11 DIAGNOSIS — I440 Atrioventricular block, first degree: Secondary | ICD-10-CM

## 2020-12-11 DIAGNOSIS — R0683 Snoring: Secondary | ICD-10-CM

## 2020-12-11 DIAGNOSIS — I10 Essential (primary) hypertension: Secondary | ICD-10-CM

## 2020-12-11 MED ORDER — METOPROLOL SUCCINATE ER 50 MG PO TB24: 50.0000 mg | ORAL_TABLET | Freq: Every day | ORAL | 0 refills | Status: AC

## 2020-12-11 NOTE — Progress Notes (Signed)
Date:  12/11/2020   ID:  Jimmy Hamilton, DOB [birth year], MRN [id number]  PCP:  Rometta Emery, MD  Cardiologist:  Tessa Lerner, DO, [facility] (established care 08/29/2020) Former Cardiology Providers: Dr. Zoila Shutter and Marjie Skiff PA-C  Date: 12/11/20 Last Office Visit: 08/29/2020  Chief Complaint  Patient presents with  . Hypertension  . Follow-up    3 month  . Chest Pain    HPI  Jimmy Hamilton is a 65 y.o. male who presents to the office with a chief complaint of " Chest Pain." Patient's past medical history and cardiovascular risk factors include: Hypertension, GERD, obesity due to excess calorie, hx of DVT.    He is referred to the office at the request of Rometta Emery, MD for evaluation of hypertension and snoring.  Patient is referred to the office for evaluation of hypertension and snoring.  His BP is usually well controlled; however, today he forgot to take his medications. He is requesting a referral to sleep medicine to be evaluated for sleep apnea. At the last office visit he mentioned that he has difficulty in breathing while asleep.  He states that he snores excessively and frequently falls asleep.  He gets about 4-6 hours of sleep per day.  No sleeping aids.  He is a mouth breather as well.  When he wakes up he does not complain of any headaches or dry mouth.  Patient had brought in his audio clips and pictures to convey the fact that he is a mouth breather and excessive snoring at night.  Patient states that he is been having chest pain over the last several days.  Located anteriorly, intensity 1 out of 10, not brought on by effort related activities, does not resolve with rest, usually self-limited prior, and last for a few seconds.He recently had an echocardiogram and stress test performed by his former cardiologist.  Results were reviewed and noted below for further reference.  FUNCTIONAL STATUS: No structured exercise program or daily routine.   ALLERGIES: No  Known Allergies  MEDICATION LIST PRIOR TO VISIT: Current Meds  Medication Sig  . hydrochlorothiazide (HYDRODIURIL) 25 MG tablet Take 25 mg by mouth daily.  . methocarbamol (ROBAXIN) 500 MG tablet Take 1 tablet (500 mg total) by mouth at bedtime as needed for muscle spasms.  . metoprolol succinate (TOPROL-XL) 50 MG 24 hr tablet Take 1 tablet (50 mg total) by mouth daily. Hold if systolic blood pressure (top blood pressure number) less than 100 mmHg or heart rate less than 60 bpm (pulse).  Marland Kitchen omeprazole (PRILOSEC) 40 MG capsule Take 40 mg by mouth 2 (two) times daily.  Marland Kitchen oxyCODONE (OXY IR/ROXICODONE) 5 MG immediate release tablet Take 1 tablet (5 mg total) by mouth every 6 (six) hours as needed for moderate pain or severe pain.  Marland Kitchen testosterone cypionate (DEPOTESTOSTERONE CYPIONATE) 200 MG/ML injection Inject 1 mg into the skin every 14 (fourteen) days.     PAST MEDICAL HISTORY: Past Medical History:  Diagnosis Date  . Chest pain 04/2019   ed to hospital admission; stress and echo both normal ; patient reports his PCP at the time told him he did not need to see cardiology   . DVT (deep venous thrombosis) (HCC) 2019   left leg  . GERD (gastroesophageal reflux disease)   . Hypertension   . Kidney stone    passed independently     PAST SURGICAL HISTORY: Past Surgical History:  Procedure Laterality Date  . CHOLECYSTECTOMY N/A 12/07/2019  Procedure: LAPAROSCOPIC CHOLECYSTECTOMY;  Surgeon: Abigail Miyamoto, MD;  Location: [facility];  Service: General;  Laterality: N/A;  . COLONOSCOPY      FAMILY HISTORY: The patient family history includes Diabetes in his brother.  SOCIAL HISTORY:  The patient  reports that he has never smoked. He has never used smokeless tobacco. He reports that he does not drink alcohol and does not use drugs.  REVIEW OF SYSTEMS: Review of Systems  Constitutional: Negative for chills and fever.  HENT: Negative for hoarse voice and nosebleeds.    Eyes: Negative for discharge, double vision and pain.  Cardiovascular: Negative for chest pain, claudication, dyspnea on exertion, leg swelling, near-syncope, orthopnea, palpitations, paroxysmal nocturnal dyspnea and syncope.  Respiratory: Positive for sleep disturbances due to breathing and snoring. Negative for hemoptysis and shortness of breath.   Musculoskeletal: Negative for muscle cramps and myalgias.  Gastrointestinal: Negative for abdominal pain, constipation, diarrhea, hematemesis, hematochezia, melena, nausea and vomiting.  Neurological: Negative for dizziness and light-headedness.    PHYSICAL EXAM: Vitals with BMI 12/11/2020 08/29/2020 04/23/2020  Height 5\' 9"  5\' 9"  -  Weight 230 lbs 234 lbs -  BMI 33.95 34.54 -  Systolic 149 132  Diastolic 86 79 80  Pulse 110 87 91   CONSTITUTIONAL: Well-developed and well-nourished. No acute distress.  SKIN: Skin is warm and dry. No rash noted. No cyanosis. No pallor. No jaundice HEAD: Normocephalic and atraumatic.  EYES: No scleral icterus MOUTH/THROAT: Moist oral membranes.  NECK: No JVD present. No thyromegaly noted. No carotid bruits  LYMPHATIC: No visible cervical adenopathy.  CHEST Normal respiratory effort. No intercostal retractions  LUNGS: Clear to auscultation bilaterally.  No stridor. No wheezes. No rales.  CARDIOVASCULAR: Regular rate and rhythm, positive S1-S2, no murmurs rubs or gallops appreciated. ABDOMINAL: Abdominal obesity, soft, nontender, nondistended, positive bowel sounds all 4 quadrants, no apparent ascites.  EXTREMITIES: No peripheral edema  HEMATOLOGIC: No significant bruising NEUROLOGIC: Oriented to person, place, and time. Nonfocal. Normal muscle tone.  PSYCHIATRIC: Normal mood and affect. Normal behavior. Cooperative  CARDIAC DATABASE: EKG: 12/11/2020: Sinus  Rhythm, 98bpm, LAD, LAFB, first degree A-V block, LVH voltage criteria, consider old anterior infarct, nonspecific T wave abnormalities, without  injury pattern.  Echocardiogram: 04/05/2019: 1. The left ventricle has normal systolic function with an ejection fraction of 60-65%. The cavity size was normal. Left ventricular diastolic parameters were normal. No evidence of left ventricular regional wall  motion abnormalities.  2. The right ventricle has normal systolic function. The cavity was normal. There is no increase in right ventricular wall thickness. Right ventricular systolic pressure could not be assessed.  3. Left atrial size was mildly dilated.  4. The aortic root is normal in size and structure.    Stress Testing: Myoview 04/15/2019:  Nuclear stress EF: 49%.  There was no ST segment deviation noted during stress.  T wave inversion present at rest, unchanged with stress.  This is a low risk study.  The left ventricular ejection fraction is mildly decreased (45-54%). Visually appears normal, 60%.  LABORATORY DATA: CBC Latest Ref Rng & Units 03/01/2020 06/22/2019 04/04/2019  WBC 4.0 - 10.5 K/uL 6.1 5.9 6.0  Hemoglobin 13.0 - 17.0 g/dL 06/24/2019 06/04/2019 85.0  Hematocrit 39.0 - 52.0 % 41.1 42.3 44.4  Platelets 150 - 400 K/uL 251 231 229    CMP Latest Ref Rng & Units 03/01/2020 12/02/2019 06/22/2019  Glucose 70 - 99 mg/dL 12/04/2019) 06/24/2019) 878(M)  BUN 8 - 23 mg/dL 16 14 12  Creatinine 0.61 - 1.24 mg/dL 2.54(Y) 7.06(C) 3.76(E)  Sodium 135 - 145 mmol/L 139 140 138  Potassium 3.5 - 5.1 mmol/L 3.8 3.7 3.2(L)  Chloride 98 - 111 mmol/L 104 103 103  CO2 22 - 32 mmol/L 26 27 24   Calcium 8.9 - 10.3 mg/dL 9.0 9.7 9.1  Total Protein 6.5 - 8.1 g/dL - - 7.2  Total Bilirubin 0.3 - 1.2 mg/dL - - 0.5  Alkaline Phos 38 - 126 U/L - - 72  AST 15 - 41 U/L - - 26  ALT 0 - 44 U/L - - 31    Lipid Panel     Component Value Date/Time   CHOL 156 04/05/2019 0542   TRIG 54 04/05/2019 0542   HDL 50 04/05/2019 0542   CHOLHDL 3.1 04/05/2019 0542   VLDL 11 04/05/2019 0542   LDLCALC 95 04/05/2019 0542    No components found for: NTPROBNP No  results for input(s): PROBNP in the last 8760 hours. No results for input(s): TSH in the last 8760 hours.  BMP Recent Labs    03/01/20 0505  NA 139  K 3.8  CL 104  CO2 26  GLUCOSE 123*  BUN 16  CREATININE 1.25*  CALCIUM 9.0  GFRNONAA >60  GFRAA >60    HEMOGLOBIN A1C Lab Results  Component Value Date   HGBA1C 6.2 (H) 04/05/2019   MPG 131.24 04/05/2019    IMPRESSION:    ICD-10-CM   1. Precordial pain  R07.2 metoprolol succinate (TOPROL-XL) 50 MG 24 hr tablet  2. Benign hypertension  I10 EKG 12-Lead    metoprolol succinate (TOPROL-XL) 50 MG 24 hr tablet  3. Snoring  R06.83 Ambulatory referral to Neurology  4. First degree AV block  I44.0   5. Class 1 obesity due to excess calories without serious comorbidity with body mass index (BMI) of 34.0 to 34.9 in adult  E66.09    Z68.34      RECOMMENDATIONS: Jimmy Hamilton is a 65 y.o. male whose past medical history and cardiac risk factors include: Hypertension, GERD, obesity due to excess calorie, hx of DVT.    Precordial pain:   Most likely non-cardiac in etiology and had an ischemic evaluation by his former cardiology including echo and stress test.   EKG show NSR w/o underlying injury pattern.   Add Toprol XL 50mg  po qAM for his discomfort and tachycardia. Will re-evaluate.   He is asked to seek medical attention by going to the closest ER via EMS if his symptoms increase in intensity, frequency, duration, or had typical discomfort as discussed during his visit.   We did discuss CCTA but he would like to hold off on additional testing at this time.   Benign essential hypertension:  Office blood pressure is not in acceptable range probably due to not taking his med. He will monitor.   Low-salt diet recommended.  He is asked to call the office if his systolic blood pressures at home are consistently greater than 140 mmHg.  Medications reconciled.  Snoring: Will refer to sleep medicine at patient's request to rule  out sleep apnea.   Obesity, due to excess calories: Body mass index is 33.97 kg/m. . I reviewed with the patient the importance of diet, regular physical activity/exercise, weight loss.   . Patient is educated on increasing physical activity gradually as tolerated.  With the goal of moderate intensity exercise for 30 minutes a day 5 days a week.  FINAL MEDICATION LIST END OF ENCOUNTER: Meds ordered this  encounter  Medications  . metoprolol succinate (TOPROL-XL) 50 MG 24 hr tablet    Sig: Take 1 tablet (50 mg total) by mouth daily. Hold if systolic blood pressure (top blood pressure number) less than 100 mmHg or heart rate less than 60 bpm (pulse).    Dispense:  90 tablet    Refill:  0     Current Outpatient Medications:  .  hydrochlorothiazide (HYDRODIURIL) 25 MG tablet, Take 25 mg by mouth daily., Disp: , Rfl:  .  methocarbamol (ROBAXIN) 500 MG tablet, Take 1 tablet (500 mg total) by mouth at bedtime as needed for muscle spasms., Disp: 14 tablet, Rfl: 0 .  metoprolol succinate (TOPROL-XL) 50 MG 24 hr tablet, Take 1 tablet (50 mg total) by mouth daily. Hold if systolic blood pressure (top blood pressure number) less than 100 mmHg or heart rate less than 60 bpm (pulse)., Disp: 90 tablet, Rfl: 0 .  omeprazole (PRILOSEC) 40 MG capsule, Take 40 mg by mouth 2 (two) times daily., Disp: , Rfl:  .  oxyCODONE (OXY IR/ROXICODONE) 5 MG immediate release tablet, Take 1 tablet (5 mg total) by mouth every 6 (six) hours as needed for moderate pain or severe pain., Disp: 25 tablet, Rfl: 0 .  testosterone cypionate (DEPOTESTOSTERONE CYPIONATE) 200 MG/ML injection, Inject 1 mg into the skin every 14 (fourteen) days., Disp: , Rfl:  .  ibuprofen (ADVIL) 800 MG tablet, Take 800 mg by mouth 3 (three) times daily., Disp: , Rfl:   Orders Placed This Encounter  Procedures  . Ambulatory referral to Neurology  . EKG 12-Lead    There are no Patient Instructions on file for this visit.   --Continue cardiac  medications as reconciled in final medication list. --Return in about 4 weeks (around 01/08/2021) for Follow up, Chest pain. Or sooner if needed. --Continue follow-up with your primary care physician regarding the management of your other chronic comorbid conditions.  Patient's questions and concerns were addressed to his satisfaction. He voices understanding of the instructions provided during this encounter.   This note was created using a voice recognition software as a result there may be grammatical errors inadvertently enclosed that do not reflect the nature of this encounter. Every attempt is made to correct such errors.  Total time spent: 45 minutes greater than 50% spent face-to-face, discussing disease management, recommendations conveyed over the phone to the patient's PCPs office, independently reviewed prior echocardiogram and stress test reports findings summarized above, and coordination of care.  Tessa Lerner, [place], [facility]  Pager: [phone] Office: [phone]

## 2020-12-31 ENCOUNTER — Institutional Professional Consult (permissible substitution): Payer: Self-pay | Admitting: Neurology

## 2020-12-31 ENCOUNTER — Telehealth: Payer: Self-pay | Admitting: Neurology

## 2020-12-31 NOTE — Telephone Encounter (Signed)
Pt was a no show to the sleep consult apt today.

## 2021-01-08 ENCOUNTER — Ambulatory Visit: Payer: 59 | Admitting: Cardiology

## 2021-02-07 IMAGING — XA Imaging study
2 series · 2 of 2 positions shown · non-contrast
Comparison: none

CLINICAL DATA: Lumbosacral spondylosis without myelopathy.
Bilateral low back pain and right anterior leg pain. Large disc
extrusion at L3-L4 affecting the right L4 nerve root. L3-L4 epidural
injection requested.

[Series 2: ortho standard · 1 of 1 slices shown (1 of 2)]
[im 1/1]
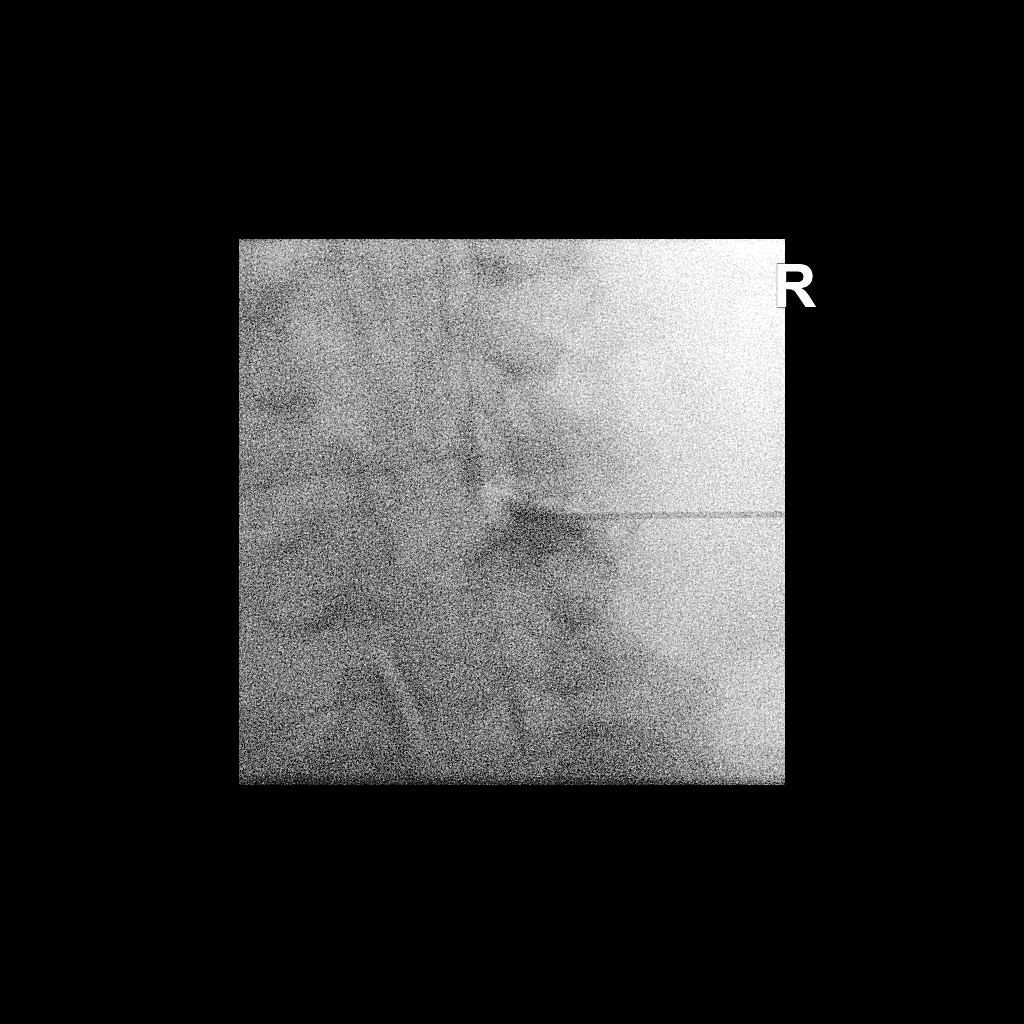

[Series 3: ortho standard · 1 of 1 slices shown (2 of 2)]
[im 1/1]
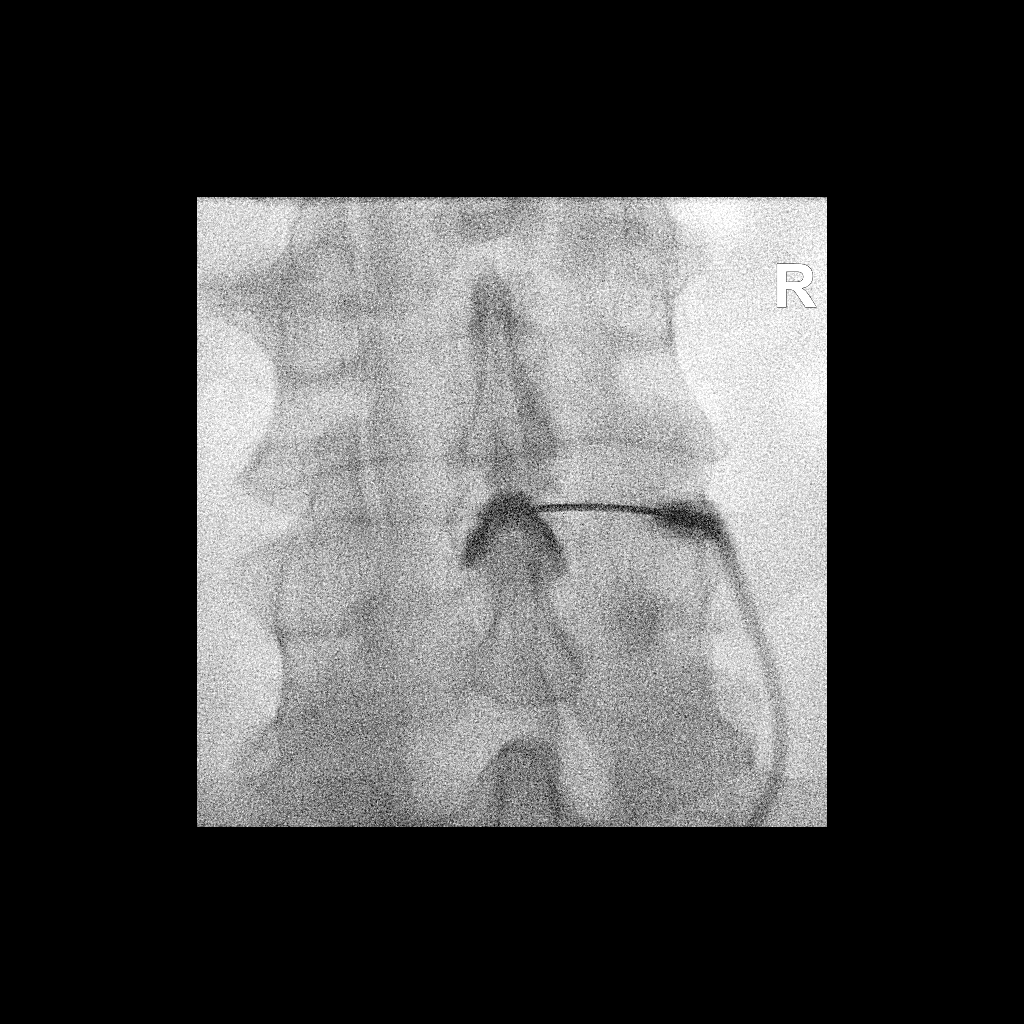

[2 of 2 positions shown; findings below may reference images not displayed]

FLUOROSCOPY TIME:  Radiation Exposure Index (as provided by the
fluoroscopic device): 10.2 mGy

Fluoroscopy Time:  1 minute, 13 seconds

Number of Acquired Images:  0

PROCEDURE:
The procedure, risks, benefits, and alternatives were explained to
the patient. Questions regarding the procedure were encouraged and
answered. The patient understands and consents to the procedure.

LUMBAR EPIDURAL INJECTION:

An interlaminar approach was performed on the right at L3-L4. The
overlying skin was cleansed and anesthetized. A 3.5 inch 20 gauge
epidural needle was advanced using loss-of-resistance technique.

DIAGNOSTIC EPIDURAL INJECTION:

Initial injection of Isovue-M 200 demonstrated subligamentous
spread. The needle was slightly advanced until contrast injection
showed a good epidural pattern with spread above and below the level
of needle placement, primarily on the right. No vascular
opacification is seen.

THERAPEUTIC EPIDURAL INJECTION:

120 mg of Depo-Medrol mixed with 3 mL of 1% lidocaine were
instilled. The procedure was well-tolerated, and the patient was
discharged thirty minutes following the injection in good condition.

COMPLICATIONS:
None immediate.
IMPRESSION: Technically successful interlaminar epidural injection on the right
at L3-L4. If the patient has no or minimal improvement in symptoms,
a right L4 nerve root block may be beneficial.

## 2021-02-14 ENCOUNTER — Institutional Professional Consult (permissible substitution): Payer: Self-pay | Admitting: Neurology

## 2021-03-07 ENCOUNTER — Ambulatory Visit: Payer: 59 | Admitting: Cardiology

## 2023-04-09 ENCOUNTER — Ambulatory Visit: Payer: Self-pay | Admitting: Physician Assistant

## 2023-10-14 ENCOUNTER — Ambulatory Visit (HOSPITAL_COMMUNITY)
Admission: EM | Admit: 2023-10-14 | Discharge: 2023-10-14 | Disposition: A | Discharge status: Home / Self Care | Payer: Medicare Other

## 2023-10-14 ENCOUNTER — Ambulatory Visit (INDEPENDENT_AMBULATORY_CARE_PROVIDER_SITE_OTHER): Payer: Medicare Other

## 2023-10-14 ENCOUNTER — Encounter (HOSPITAL_COMMUNITY): Payer: Self-pay | Admitting: *Deleted

## 2023-10-14 DIAGNOSIS — R0789 Other chest pain: Secondary | ICD-10-CM

## 2023-10-14 DIAGNOSIS — S39012A Strain of muscle, fascia and tendon of lower back, initial encounter: Secondary | ICD-10-CM

## 2023-10-14 MED ORDER — NAPROXEN 375 MG PO TABS: 375.0000 mg | ORAL_TABLET | Freq: Two times a day (BID) | ORAL | 0 refills | Status: DC | PRN

## 2023-10-14 MED ORDER — CYCLOBENZAPRINE HCL 10 MG PO TABS: 10.0000 mg | ORAL_TABLET | Freq: Two times a day (BID) | ORAL | 0 refills | Status: DC | PRN
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
## 2023-10-14 NOTE — Discharge Instructions (Addendum)
I have prescribed you muscle relaxers take only if you are not driving.  Take 1 tablet up to twice daily or 1 tablet every 12 hours as needed for pain.  Take naproxen 375 twice daily for the next 3 to 5 days.  If your symptoms worsen or do not improve return for reevaluation.  As discussed with the chest wall pain I have obtained your EKG and it appears unchanged from previous.  Pain you are describing today does sound like chest wall pain however if the pain in your chest persist go immediately to the emergency department.

## 2024-06-24 ENCOUNTER — Encounter (HOSPITAL_COMMUNITY): Payer: Self-pay

## 2024-06-24 ENCOUNTER — Ambulatory Visit (HOSPITAL_COMMUNITY)
Admission: EM | Admit: 2024-06-24 | Discharge: 2024-06-24 | Disposition: A | Discharge status: Home / Self Care | Attending: Family Medicine | Admitting: Family Medicine

## 2024-06-24 DIAGNOSIS — N390 Urinary tract infection, site not specified: Secondary | ICD-10-CM | POA: Diagnosis not present

## 2024-06-24 DIAGNOSIS — R300 Dysuria: Secondary | ICD-10-CM | POA: Diagnosis not present

## 2024-06-24 DIAGNOSIS — R6883 Chills (without fever): Secondary | ICD-10-CM

## 2024-06-24 LAB — POC SARS CORONAVIRUS 2 AG -  ED: SARS Coronavirus 2 Ag: NEGATIVE

## 2024-06-24 LAB — POCT URINALYSIS DIP (MANUAL ENTRY)
Bilirubin, UA: NEGATIVE
Glucose, UA: 100 mg/dL — AB
Ketones, POC UA: NEGATIVE mg/dL
Nitrite, UA: POSITIVE — AB
Protein Ur, POC: 30 mg/dL — AB
Spec Grav, UA: 1.01 (ref 1.010–1.025)
Urobilinogen, UA: 0.2 U/dL
pH, UA: 5.5 (ref 5.0–8.0)

## 2024-06-24 MED ORDER — CIPROFLOXACIN HCL 250 MG PO TABS: 250.0000 mg | ORAL_TABLET | Freq: Two times a day (BID) | ORAL | 0 refills | Status: AC

## 2024-06-24 NOTE — ED Provider Notes (Signed)
 [facility]    CSN: [id number] Arrival date & time: 06/24/24  0813      History   Chief Complaint Chief Complaint  Patient presents with   Chills   Dysuria    HPI Jimmy Hamilton is a 68 y.o. male.    Dysuria Presenting symptoms: dysuria   Associated symptoms: urinary frequency   Associated symptoms: no fever    Patient is here for not feeling well.  He is having chills since yesterday.  He is having burning with urination as well.   He is having urinary frequency, up all night urinating.  No n/v.  He is having decreased taste to his food, poor appetite.  He states he has had a uti before.      Past Medical History:  Diagnosis Date   Chest pain 04/2019   ed to hospital admission; stress and echo both normal ; patient reports his PCP at the time told him he did not need to see cardiology    DVT (deep venous thrombosis) (HCC) 2019   left leg   GERD (gastroesophageal reflux disease)    Hypertension    Kidney stone    passed independently     Patient Active Problem List   Diagnosis Date Noted   Chest pain 04/05/2019   Essential hypertension 04/04/2019    Past Surgical History:  Procedure Laterality Date   CHOLECYSTECTOMY N/A 12/07/2019   Procedure: LAPAROSCOPIC CHOLECYSTECTOMY;  Surgeon: Vernetta Berg, MD;  Location: [facility];  Service: General;  Laterality: N/A;   COLONOSCOPY         Home Medications    Prior to Admission medications   Medication Sig Start Date End Date Taking? Authorizing Provider  cyclobenzaprine  (FLEXERIL ) 10 MG tablet Take 1 tablet (10 mg total) by mouth 2 (two) times daily as needed for muscle spasms. Patient not taking: Reported on 06/24/2024 10/14/23   Arloa Suzen RAMAN, NP  hydrochlorothiazide  (HYDRODIURIL ) 25 MG tablet Take 25 mg by mouth daily.    [provider]  ibuprofen  (ADVIL ) 800 MG tablet Take 800 mg by mouth 3 (three) times daily. Patient not taking: Reported on 06/24/2024  11/19/20   [provider]  metFORMIN (GLUCOPHAGE) 500 MG tablet Take 500 mg by mouth 2 (two) times daily. Patient not taking: Reported on 06/24/2024 09/19/23   [provider]  metoprolol  succinate (TOPROL -XL) 50 MG 24 hr tablet Take 1 tablet (50 mg total) by mouth daily. Hold if systolic blood pressure (top blood pressure number) less than 100 mmHg or heart rate less than 60 bpm (pulse). 12/11/20 03/11/21  Tolia, Sunit, DO  naproxen  (NAPROSYN ) 375 MG tablet Take 1 tablet (375 mg total) by mouth 2 (two) times daily as needed (Back pain and chest wall pain). Patient not taking: Reported on 06/24/2024 10/14/23   Arloa Suzen RAMAN, NP  omeprazole (PRILOSEC) 40 MG capsule Take 40 mg by mouth 2 (two) times daily. Patient not taking: Reported on 06/24/2024 08/19/20   [provider]  oxyCODONE  (OXY IR/ROXICODONE ) 5 MG immediate release tablet Take 1 tablet (5 mg total) by mouth every 6 (six) hours as needed for moderate pain or severe pain. Patient not taking: Reported on 06/24/2024 12/07/19   Vernetta Berg, MD  testosterone cypionate (DEPOTESTOSTERONE CYPIONATE) 200 MG/ML injection Inject 1 mg into the skin every 14 (fourteen) days. Patient not taking: Reported on 06/24/2024 07/30/20   [provider]    Family History Family History  Problem Relation Age of Onset  Diabetes Brother    CAD Neg Hx    Cancer Neg Hx     Social History Social History   Tobacco Use   Smoking status: Never   Smokeless tobacco: Never  Vaping Use   Vaping status: Never Used  Substance Use Topics   Alcohol use: No   Drug use: No     Allergies   Patient has no known allergies.   Review of Systems Review of Systems  Constitutional:  Positive for chills. Negative for fever.  HENT: Negative.    Respiratory: Negative.    Cardiovascular: Negative.   Gastrointestinal: Negative.   Genitourinary:  Positive for dysuria and frequency.  Musculoskeletal: Negative.    Psychiatric/Behavioral: Negative.       Physical Exam Triage Vital Signs ED Triage Vitals  Encounter Vitals Group     BP 06/24/24 0927 132/83     Girls Systolic BP Percentile --      Girls Diastolic BP Percentile --      Boys Systolic BP Percentile --      Boys Diastolic BP Percentile --      Pulse Rate 06/24/24 0927 95     Resp 06/24/24 0927 16     Temp 06/24/24 0927 98 F (36.7 C)     Temp Source 06/24/24 0927 Oral     SpO2 06/24/24 0927 95 %     Weight --      Height --      Head Circumference --      Peak Flow --      Pain Score 06/24/24 0928 0     Pain Loc --      Pain Education --      Exclude from Growth Chart --    No data found.  Updated Vital Signs BP 132/83 (BP Location: Left Arm)   Pulse 95   Temp 98 F (36.7 C) (Oral)   Resp 16   SpO2 95%   Visual Acuity Right Eye Distance:   Left Eye Distance:   Bilateral Distance:    Right Eye Near:   Left Eye Near:    Bilateral Near:     Physical Exam Constitutional:      General: He is not in acute distress.    Appearance: Normal appearance. He is normal weight. He is not ill-appearing or toxic-appearing.  HENT:     Nose: Nose normal.     Mouth/Throat:     Mouth: Mucous membranes are moist.  Cardiovascular:     Rate and Rhythm: Normal rate and regular rhythm.  Pulmonary:     Effort: Pulmonary effort is normal.     Breath sounds: Normal breath sounds.  Abdominal:     General: Bowel sounds are normal.     Palpations: Abdomen is soft.     Tenderness: There is left CVA tenderness. There is no guarding or rebound.  Musculoskeletal:     Cervical back: Normal range of motion and neck supple. No tenderness.  Skin:    General: Skin is warm.  Neurological:     General: No focal deficit present.     Mental Status: He is alert.  Psychiatric:        Mood and Affect: Mood normal.      UC Treatments / Results  Labs (all labs ordered are listed, but only abnormal results are displayed) Labs Reviewed   POCT URINALYSIS DIP (MANUAL ENTRY) - Abnormal; Notable for the following components:      Result Value  Glucose, UA =100 (*)    Blood, UA large (*)    Protein Ur, POC =30 (*)    Nitrite, UA Positive (*)    Leukocytes, UA Moderate (2+) (*)    All other components within normal limits  URINE CULTURE  POC SARS CORONAVIRUS 2 AG -  ED   Covid negative  EKG   Radiology No results found.  Procedures Procedures (including critical care time)  Medications Ordered in UC Medications - No data to display  Initial Impression / Assessment and Plan / UC Course  I have reviewed the triage vital signs and the nursing notes.  Pertinent labs & imaging results that were available during my care of the patient were reviewed by me and considered in my medical decision making (see chart for details).   Patient was seen today for UTI.  I have treated him with Cipro  today . Last labs show slightly elevated Cr.  He denies any kidney issues.  Will treat as below.   Final Clinical Impressions(s) / UC Diagnoses   Final diagnoses:  Chills  Dysuria  Urinary tract infection without hematuria, site unspecified     Discharge Instructions      You were diagnosed with a urinary tract infection.  I have sent out a mediation to treat this.  Please increase fluid intake.  If you have worsening symptoms despite medication then please go to the ER for further evaluation.     ED Prescriptions     Medication Sig Dispense Auth. Provider   ciprofloxacin  (CIPRO ) 250 MG tablet Take 1 tablet (250 mg total) by mouth every 12 (twelve) hours for 7 days. 14 tablet Darral Longs, MD      PDMP not reviewed this encounter.   Darral Longs, MD 06/24/24 1006

## 2024-06-24 NOTE — Discharge Instructions (Addendum)
 You were diagnosed with a urinary tract infection.  I have sent out a mediation to treat this.  Please increase fluid intake.  If you have worsening symptoms despite medication then please go to the ER for further evaluation.

## 2024-06-24 NOTE — ED Triage Notes (Signed)
 Patient states that he is having chills and his pee is burning and too hot. Since yesterday.. Patient also says he does not have any taste from his food.  Patient states he has been taking Tylenol  for his symptoms.

## 2024-06-26 LAB — URINE CULTURE: Culture: >=100000 — AB

## 2024-06-27 ENCOUNTER — Ambulatory Visit (HOSPITAL_COMMUNITY): Payer: Self-pay

## 2024-06-27 MED ORDER — NITROFURANTOIN MONOHYD MACRO 100 MG PO CAPS: 100.0000 mg | ORAL_CAPSULE | Freq: Two times a day (BID) | ORAL | 0 refills | Status: AC

## 2024-07-06 ENCOUNTER — Emergency Department (HOSPITAL_COMMUNITY)
Admission: EM | Admit: 2024-07-06 | Discharge: 2024-07-07 | Disposition: A | Discharge status: Home / Self Care | Attending: Emergency Medicine | Admitting: Emergency Medicine

## 2024-07-06 ENCOUNTER — Encounter (HOSPITAL_COMMUNITY): Payer: Self-pay | Admitting: Emergency Medicine

## 2024-07-06 ENCOUNTER — Other Ambulatory Visit: Payer: Self-pay

## 2024-07-06 DIAGNOSIS — N3000 Acute cystitis without hematuria: Secondary | ICD-10-CM | POA: Insufficient documentation

## 2024-07-06 DIAGNOSIS — R300 Dysuria: Secondary | ICD-10-CM | POA: Diagnosis present

## 2024-07-06 LAB — BASIC METABOLIC PANEL WITH GFR
Anion gap: 13 (ref 5–15)
BUN: 13 mg/dL (ref 8–23)
CO2: 25 mmol/L (ref 22–32)
Calcium: 9.4 mg/dL (ref 8.9–10.3)
Chloride: 100 mmol/L (ref 98–111)
Creatinine, Ser: 1.06 mg/dL (ref 0.61–1.24)
GFR, Estimated: >60 mL/min (ref >60)
Glucose, Bld: 158 mg/dL — ABNORMAL HIGH (ref 70–99)
Potassium: 3.4 mmol/L — ABNORMAL LOW (ref 3.5–5.1)
Sodium: 138 mmol/L (ref 135–145)

## 2024-07-06 LAB — CBC
HCT: 42.5 % (ref 39.0–52.0)
Hemoglobin: 13.9 g/dL (ref 13.0–17.0)
MCH: 28.2 pg (ref 26.0–34.0)
MCHC: 32.7 g/dL (ref 30.0–36.0)
MCV: 86.2 fL (ref 80.0–100.0)
Platelets: 291 K/uL (ref 150–400)
RBC: 4.93 MIL/uL (ref 4.22–5.81)
RDW: 13.1 % (ref 11.5–15.5)
WBC: 12.9 K/uL — ABNORMAL HIGH (ref 4.0–10.5)
nRBC: 0 % (ref 0.0–0.2)

## 2024-07-06 MED ORDER — LACTATED RINGERS IV BOLUS
1000.0000 mL | Freq: Once | INTRAVENOUS | Status: AC
  Administered 2024-07-07: 1000 mL via INTRAVENOUS

## 2024-07-06 NOTE — ED Triage Notes (Signed)
 68 y/o male comes in c/o blood in his urine that started around 7pm this evening. Pt reports he's being treated for a right sided kidney infection and reports taking all of his antibiotics. Pt reports the blood is brown in color.

## 2024-07-06 NOTE — ED Provider Notes (Signed)
 [facility] AT [facility] Provider Note   CSN: [id number] Arrival date & time: 07/06/24  2238     History Chief Complaint  Patient presents with   Hematuria    HPI Jimmy Hamilton is a 68 y.o. male presenting for chief complaint of dysuria and hematuria.  He is a very challenging historian.  He only answers yes or no to questions but is inconsistent in his answers.  He is very distracted despite repeated prompting regarding questions.  He answered a phone call in the middle of the conversation.  Stated he did not want an interpreter.  Very tangential.  States he has been on antibiotics for UTI but is finished them a few weeks ago. States the pain got worse tonight.  Ambulatory tolerating p.o. intake.   Patient's recorded medical, surgical, social, medication list and allergies were reviewed in the Snapshot window as part of the initial history.   Review of Systems   Review of Systems  Constitutional:  Negative for chills and fever.  HENT:  Negative for ear pain and sore throat.   Eyes:  Negative for pain and visual disturbance.  Respiratory:  Negative for cough and shortness of breath.   Cardiovascular:  Negative for chest pain and palpitations.  Gastrointestinal:  Negative for abdominal pain and vomiting.  Genitourinary:  Positive for dysuria. Negative for hematuria.  Musculoskeletal:  Negative for arthralgias and back pain.  Skin:  Negative for color change and rash.  Neurological:  Negative for seizures and syncope.  All other systems reviewed and are negative.   Physical Exam Updated Vital Signs BP (!) 148/78   Pulse (!) 102   Temp 97.8 F (36.6 C) (Oral)   Resp 18   Ht 5' 9 (1.753 m)   Wt 104.3 kg   SpO2 99%   BMI 33.97 kg/m  Physical Exam Vitals and nursing note reviewed.  Constitutional:      General: He is not in acute distress.    Appearance: He is well-developed.  HENT:     Head: Normocephalic and atraumatic.  Eyes:      Conjunctiva/sclera: Conjunctivae normal.  Cardiovascular:     Rate and Rhythm: Normal rate and regular rhythm.     Heart sounds: No murmur heard. Pulmonary:     Effort: Pulmonary effort is normal. No respiratory distress.     Breath sounds: Normal breath sounds.  Abdominal:     Palpations: Abdomen is soft.     Tenderness: There is no abdominal tenderness.  Musculoskeletal:        General: No swelling.     Cervical back: Neck supple.  Skin:    General: Skin is warm and dry.     Capillary Refill: Capillary refill takes less than 2 seconds.  Neurological:     Mental Status: He is alert.  Psychiatric:        Mood and Affect: Mood normal.      ED Course/ Medical Decision Making/ A&P    Procedures Procedures   Medications Ordered in ED Medications  ketorolac  (TORADOL ) 15 MG/ML injection 15 mg (has no administration in time range)  lactated ringers  bolus 1,000 mL (0 mLs Intravenous Stopped 07/07/24 0141)  iohexol  (OMNIPAQUE ) 300 MG/ML solution 100 mL (100 mLs Intravenous Contrast Given 07/07/24 0209)  cefTRIAXone  (ROCEPHIN ) 1 g in sodium chloride  0.9 % 100 mL IVPB (1 g Intravenous New Bag/Given 07/07/24 0344)   Medical Decision Making:   Jimmy Hamilton is a 68 y.o. male who  presented to the ED today with abdominal pain, detailed above.    Patient placed on continuous vitals and telemetry monitoring while in ED which was reviewed periodically.  Complete initial physical exam performed, notably the patient  was HDS in NAD.     Reviewed and confirmed nursing documentation for past medical history, family history, social history.    Initial Assessment:   With the patient's presentation of abdominal pain, most likely diagnosis is nonspecific etiology. Other diagnoses were considered including (but not limited to) gastroenteritis, colitis, small bowel obstruction, appendicitis, cholecystitis, pancreatitis, nephrolithiasis, UTI, pyleonephritis. These are considered less likely due to history  of present illness and physical exam findings.   This is most consistent with an acute life/limb threatening illness complicated by underlying chronic conditions.   Initial Plan:  CBC/CMP to evaluate for underlying infectious/metabolic etiology for patient's abdominal pain  Lipase to evaluate for pancreatitis  EKG to evaluate for cardiac source of pain  CTAB/Pelvis with contrast to evaluate for structural/surgical etiology of patients' severe abdominal pain.  Urinalysis and repeat physical assessment to evaluate for UTI/Pyelonpehritis  Empiric management of symptoms with escalating pain control and antiemetics as needed.   Initial Study Results:   Laboratory  All laboratory results reviewed without evidence of clinically relevant pathology.   Exceptions include: Leukocytosis, urinary bacteria, urinary leukocytes   EKG EKG was reviewed independently. Rate, rhythm, axis, intervals all examined and without medically relevant abnormality. ST segments without concerns for elevations.    Radiology All images reviewed independently. Agree with radiology report at this time.   CT ABDOMEN PELVIS W CONTRAST Result Date: 07/07/2024 CLINICAL DATA:  Acute nonlocalized abdominal pain EXAM: CT ABDOMEN AND PELVIS WITH CONTRAST TECHNIQUE: Multidetector CT imaging of the abdomen and pelvis was performed using the standard protocol following bolus administration of intravenous contrast. RADIATION DOSE REDUCTION: This exam was performed according to the departmental dose-optimization program which includes automated exposure control, adjustment of the mA and/or kV according to patient size and/or use of iterative reconstruction technique. CONTRAST:  OMNIPAQUE  IOHEXOL  300 MG/ML  SOLN COMPARISON:  06/22/2019 FINDINGS: Lower chest: No acute abnormality. Hepatobiliary: Moderate hepatic steatosis. No enhancing intrahepatic mass. No intra or extrahepatic biliary ductal dilation. Status post cholecystectomy.  Pancreas: Unremarkable Spleen: Unremarkable Adrenals/Urinary Tract: Adrenal glands are unremarkable. Simple cortical cysts are seen within the kidneys bilaterally for which no follow-up imaging is recommended. 5 mm nonobstructing calculus within lower pole the left kidney. The kidneys are otherwise unremarkable. Bladder demonstrates mild circumferential wall thickening and perivesicular stranding inflammatory stranding suggesting a superimposed infectious or inflammatory cystitis. The bladder is not distended. Stomach/Bowel: Stomach is within normal limits. Appendix appears normal. No evidence of bowel wall thickening, distention, or inflammatory changes. Vascular/Lymphatic: No significant vascular findings are present. No enlarged abdominal or pelvic lymph nodes. Reproductive: Prostate is unremarkable. Other: Moderate broad-based fat containing umbilical hernia Musculoskeletal: No acute bone abnormality. No lytic or blastic bone lesion. Osseous structures are age appropriate. IMPRESSION: 1. Mild circumferential bladder wall thickening and perivesicular inflammatory stranding suggesting a superimposed infectious or inflammatory cystitis. Correlation with urinalysis and urine culture may be helpful. 2. Moderate hepatic steatosis. 3. Minimal left nonobstructing nephrolithiasis. 4. Moderate broad-based fat containing umbilical hernia. Electronically Signed   By: Dorethia Molt M.D.   On: 07/07/2024 03:12    Final Reassessment and Plan:   Patient has a florid urinary tract infection.  No evidence of obstruction on CT scan.  No evidence of pyelonephritis on CT scan. He is clinically improved  after administration of Rocephin  in the emergency room.  Will transition to oral cephalosporin, recommend follow-up with PCP within 48 hours. Disposition:  I have considered need for hospitalization, however, considering all of the above, I believe this patient is stable for discharge at this time.  Patient/family educated  about specific return precautions for given chief complaint and symptoms.  Patient/family educated about follow-up with PCP.     Patient/family expressed understanding of return precautions and need for follow-up. Patient spoken to regarding all imaging and laboratory results and appropriate follow up for these results. All education provided in verbal form with additional information in written form. Time was allowed for answering of patient questions. Patient discharged.    Emergency Department Medication Summary:   Medications  ketorolac  (TORADOL ) 15 MG/ML injection 15 mg (has no administration in time range)  lactated ringers  bolus 1,000 mL (0 mLs Intravenous Stopped 07/07/24 0141)  iohexol  (OMNIPAQUE ) 300 MG/ML solution 100 mL (100 mLs Intravenous Contrast Given 07/07/24 0209)  cefTRIAXone  (ROCEPHIN ) 1 g in sodium chloride  0.9 % 100 mL IVPB (1 g Intravenous New Bag/Given 07/07/24 0344)            Clinical Impression:  1. Acute cystitis without hematuria      Discharge   Final Clinical Impression(s) / ED Diagnoses Final diagnoses:  Acute cystitis without hematuria    Rx / DC Orders ED Discharge Orders          Ordered    cefpodoxime  (VANTIN ) 200 MG tablet  2 times daily        07/07/24 0513              Jerral Meth, MD 07/07/24 [phone]

## 2024-07-07 ENCOUNTER — Emergency Department (HOSPITAL_COMMUNITY)

## 2024-07-07 LAB — URINALYSIS, ROUTINE W REFLEX MICROSCOPIC
Bilirubin Urine: NEGATIVE
Glucose, UA: NEGATIVE mg/dL
Ketones, ur: NEGATIVE mg/dL
Nitrite: NEGATIVE
Protein, ur: 100 mg/dL — AB
RBC / HPF: >50 RBC/hpf (ref 0–5)
Specific Gravity, Urine: 1.01 (ref 1.005–1.030)
WBC, UA: >50 WBC/hpf (ref 0–5)
pH: 7 (ref 5.0–8.0)

## 2024-07-07 MED ORDER — IOHEXOL 300 MG/ML  SOLN
100.0000 mL | Freq: Once | INTRAMUSCULAR | Status: AC | PRN
  Administered 2024-07-07: 100 mL via INTRAVENOUS

## 2024-07-07 MED ORDER — SODIUM CHLORIDE 0.9 % IV SOLN
1.0000 g | Freq: Once | INTRAVENOUS | Status: AC
  Administered 2024-07-07: 1 g via INTRAVENOUS
  Filled 2024-07-07: qty 10

## 2024-07-07 MED ORDER — CEFPODOXIME PROXETIL 200 MG PO TABS: 200.0000 mg | ORAL_TABLET | Freq: Two times a day (BID) | ORAL | 0 refills | Status: AC

## 2024-07-07 MED ORDER — KETOROLAC TROMETHAMINE 15 MG/ML IJ SOLN
15.0000 mg | Freq: Once | INTRAMUSCULAR | Status: AC
  Administered 2024-07-07: 15 mg via INTRAVENOUS
  Filled 2024-07-07: qty 1

## 2024-07-09 LAB — URINE CULTURE: Culture: >=100000 — AB

## 2024-07-10 ENCOUNTER — Telehealth (HOSPITAL_BASED_OUTPATIENT_CLINIC_OR_DEPARTMENT_OTHER): Payer: Self-pay | Admitting: *Deleted

## 2024-07-10 NOTE — Telephone Encounter (Signed)
 Post ED Visit - Positive Culture Follow-up  Culture report reviewed by antimicrobial stewardship pharmacist: [facility] []  Rankin Dee, Pharm.D. []  Venetia Gully, Pharm.D., BCPS AQ-ID []  Garrel Crews, Pharm.D., BCPS []  Almarie Lunger, [address].D., BCPS []  [place], [address].D., BCPS, AAHIVP []  Rosaline Bihari, Pharm.D., BCPS, AAHIVP []  Vernell Meier, PharmD, BCPS []  Latanya Hint, PharmD, BCPS []  Donald Medley, PharmD, BCPS []  Rocky Bold, PharmD []  Dorothyann Alert, PharmD, BCPS []  Morene Babe, PharmD  [facility] []  Rosaline Edison, PharmD []  Romona Bliss, PharmD []  Dolphus Roller, PharmD []  Veva Seip, Rph []  Vernell Daunt) Leonce, PharmD []  Eva Allis, PharmD []  Rosaline Millet, PharmD []  Iantha Batch, PharmD []  Arvin Gauss, PharmD []  Wanda Hasting, PharmD []  Ronal Rav, PharmD []  Rocky Slade, PharmD [x]  Bard Jeans, PharmD   Positive urine culture Treated with Cefpodoxime  Proxetil, organism sensitive to the same and no further patient follow-up is required at this time.  Albino Alan Novak 07/10/2024, 12:18 PM
